# Patient Record
Sex: Female | Born: 1970 | Hispanic: No | Marital: Single | Smoking: Never smoker
Health system: Southern US, Community
[De-identification: ages and names within clinical notes are randomized; demographics above are authoritative.]

## PROBLEM LIST (undated history)

## (undated) DIAGNOSIS — R87629 Unspecified abnormal cytological findings in specimens from vagina: Principal | ICD-10-CM

## (undated) DIAGNOSIS — T7840XA Allergy, unspecified, initial encounter: Secondary | ICD-10-CM

## (undated) DIAGNOSIS — M1990 Unspecified osteoarthritis, unspecified site: Secondary | ICD-10-CM

## (undated) DIAGNOSIS — R87619 Unspecified abnormal cytological findings in specimens from cervix uteri: Secondary | ICD-10-CM

## (undated) DIAGNOSIS — F32A Depression, unspecified: Secondary | ICD-10-CM

## (undated) DIAGNOSIS — IMO0002 Reserved for concepts with insufficient information to code with codable children: Secondary | ICD-10-CM

## (undated) DIAGNOSIS — F419 Anxiety disorder, unspecified: Secondary | ICD-10-CM

## (undated) DIAGNOSIS — O24419 Gestational diabetes mellitus in pregnancy, unspecified control: Secondary | ICD-10-CM

## (undated) DIAGNOSIS — D591 Other autoimmune hemolytic anemias: Secondary | ICD-10-CM

## (undated) DIAGNOSIS — M3500 Sicca syndrome, unspecified: Secondary | ICD-10-CM

## (undated) DIAGNOSIS — M329 Systemic lupus erythematosus, unspecified: Secondary | ICD-10-CM

## (undated) HISTORY — PX: DILATION AND CURETTAGE OF UTERUS: SHX78

## (undated) HISTORY — DX: Reserved for concepts with insufficient information to code with codable children: IMO0002

## (undated) HISTORY — DX: Sicca syndrome, unspecified: M35.00

## (undated) HISTORY — DX: Unspecified abnormal cytological findings in specimens from cervix uteri: R87.619

## (undated) HISTORY — DX: Unspecified abnormal cytological findings in specimens from vagina: R87.629

## (undated) HISTORY — PX: TUBAL LIGATION: SHX77

## (undated) HISTORY — DX: Anxiety disorder, unspecified: F41.9

## (undated) HISTORY — DX: Gestational diabetes mellitus in pregnancy, unspecified control: O24.419

## (undated) HISTORY — DX: Systemic lupus erythematosus, unspecified: M32.9

## (undated) HISTORY — DX: Allergy, unspecified, initial encounter: T78.40XA

## (undated) HISTORY — PX: COLPOSCOPY: SHX161

## (undated) HISTORY — PX: PELVIC LAPAROSCOPY: SHX162

## (undated) HISTORY — DX: Depression, unspecified: F32.A

## (undated) HISTORY — PX: CERVICAL BIOPSY  W/ LOOP ELECTRODE EXCISION: SUR135

## (undated) HISTORY — DX: Other autoimmune hemolytic anemias: D59.1

## (undated) HISTORY — DX: Unspecified osteoarthritis, unspecified site: M19.90

---

## 2006-11-21 HISTORY — PX: LAPAROTOMY: SHX154

## 2006-11-21 HISTORY — PX: TOTAL ABDOMINAL HYSTERECTOMY: SHX209

## 2017-03-07 LAB — BASIC METABOLIC PANEL
BUN: 17 (ref 4–21)
Creatinine: 0.7 (ref 0.5–1.1)
Glucose: 102
Potassium: 3.7 (ref 3.4–5.3)
SODIUM: 140 (ref 137–147)

## 2017-03-07 LAB — CBC AND DIFFERENTIAL
HEMATOCRIT: 29 — AB (ref 36–46)
HEMOGLOBIN: 9.1 — AB (ref 12.0–16.0)
NEUTROS ABS: 1
PLATELETS: 190 (ref 150–399)
WBC: 3

## 2017-03-07 LAB — HEPATIC FUNCTION PANEL
ALK PHOS: 59 (ref 25–125)
ALT: 13 (ref 7–35)
AST: 26 (ref 13–35)
BILIRUBIN, TOTAL: 0.6

## 2017-04-04 LAB — CBC AND DIFFERENTIAL
HCT: 30 — AB (ref 36–46)
Hemoglobin: 9.5 — AB (ref 12.0–16.0)
Neutrophils Absolute: 1
Platelets: 169 (ref 150–399)
WBC: 3.1

## 2017-04-04 LAB — BASIC METABOLIC PANEL
BUN: 19 (ref 4–21)
CREATININE: 0.7 (ref 0.5–1.1)
Glucose: 85
Potassium: 3.6 (ref 3.4–5.3)
SODIUM: 139 (ref 137–147)

## 2017-04-04 LAB — HEPATIC FUNCTION PANEL
ALK PHOS: 55 (ref 25–125)
ALT: 10 (ref 7–35)
AST: 21 (ref 13–35)
Bilirubin, Total: 0.6

## 2017-05-02 LAB — BASIC METABOLIC PANEL
BUN: 18 (ref 4–21)
Creatinine: 0.7 (ref 0.5–1.1)
GLUCOSE: 81
Potassium: 3.5 (ref 3.4–5.3)
Sodium: 139 (ref 137–147)

## 2017-05-02 LAB — CBC AND DIFFERENTIAL
HEMATOCRIT: 29 — AB (ref 36–46)
Hemoglobin: 9.6 — AB (ref 12.0–16.0)
NEUTROS ABS: 1
Platelets: 187 (ref 150–399)
WBC: 3.4

## 2017-05-02 LAB — HEPATIC FUNCTION PANEL
ALT: 12 (ref 7–35)
AST: 18 (ref 13–35)
Alkaline Phosphatase: 59 (ref 25–125)
BILIRUBIN, TOTAL: 0.4

## 2017-05-23 ENCOUNTER — Encounter: Payer: Self-pay | Admitting: Physician Assistant

## 2017-05-23 ENCOUNTER — Ambulatory Visit (INDEPENDENT_AMBULATORY_CARE_PROVIDER_SITE_OTHER): Payer: Medicare Other | Admitting: Physician Assistant

## 2017-05-23 VITALS — BP 140/80 | HR 80 | Temp 98.8°F | Ht 69.5 in | Wt 250.2 lb

## 2017-05-23 DIAGNOSIS — L930 Discoid lupus erythematosus: Secondary | ICD-10-CM | POA: Diagnosis not present

## 2017-05-23 DIAGNOSIS — D591 Other autoimmune hemolytic anemias: Secondary | ICD-10-CM | POA: Diagnosis not present

## 2017-05-23 DIAGNOSIS — M328 Other forms of systemic lupus erythematosus: Secondary | ICD-10-CM | POA: Insufficient documentation

## 2017-05-23 DIAGNOSIS — R87619 Unspecified abnormal cytological findings in specimens from cervix uteri: Secondary | ICD-10-CM | POA: Insufficient documentation

## 2017-05-23 DIAGNOSIS — M3500 Sicca syndrome, unspecified: Secondary | ICD-10-CM | POA: Diagnosis not present

## 2017-05-23 DIAGNOSIS — D5919 Other autoimmune hemolytic anemia: Secondary | ICD-10-CM

## 2017-05-23 HISTORY — DX: Other autoimmune hemolytic anemia: D59.19

## 2017-05-23 HISTORY — DX: Sjogren syndrome, unspecified: M35.00

## 2017-05-23 HISTORY — DX: Unspecified abnormal cytological findings in specimens from cervix uteri: R87.619

## 2017-05-23 NOTE — Progress Notes (Signed)
Kerri Young is a 46 y.o. female here to Establish Care and needs referral to rheumatologist for lupus. Has relocated from [facility].  I acted as a Neurosurgeon for [employer], PA-C Corky Mull, LPN  History of Present Illness:   Chief Complaint  Patient presents with  . Establish Care  . Needs referral for Rheumatology    Lupus   Kerri Young recently moved from [place], [place] and has a history of lupus and hemolytic anemia --> receives bi-weekly IVIG infusions. She does not have any records with her for me to review today except a list of her medications. We have put in record releases for her Ob-Gyn and Rheumatologist.  Acute Concerns: None  Chronic Issues: Lupus -- was diagnosed in 1994; gets infusions every other week; currently managed on Imuran, Belimumab, and Plaquinil; was told that she had kidney issues at first (saw a nephrologist at first diagnosis and was told her kidneys were fine); has significant pain and swelling from arthritis controlled with several pain medications (Methadone, Dilaudid); also has anxiety/depression and mental clarity issues managed with medications (Xanax prn, Celexa, Wellbutrin); skin manifestations include rashes and hives; no current cardiac issues (did see a cardiologist soon after dx and got a clean bill of health and has not returned) or pulmonary issue; no issues with spleen. 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, managed by her rheumatologist for this Atypical cells of cervix -- has had abnormal PAP smears and after hysterectomy was told that she had "unusual cells" in her ovaries and this was monitored by a Gyn-Onc, was never diagnosed with cancer Sjogrens -- has dry eyes, sees ophthalmologist q 6-12 months, is on long-term plaquenil  Health Maintenance: Immunizations -- requesting records Colonoscopy -- has never had a colonoscopy Mammogram -- up to date -- all normal PAP -- Feb 2018 - abnormal Weight -- Weight: 250 lb 4 oz (113.5 kg)    Depression screen [facility] 2/9 05/23/2017  Decreased Interest 0  Down, Depressed, Hopeless 0  PHQ - 2 Score 0    No flowsheet data found.  Other providers/specialists: Rheumatologist -- Dr. Mikey Bussing, [phone] Oncologist/Gynecologist -- Dr. Davy Pique, [phone]     Past Medical History:  Diagnosis Date  . Atypical endocervical cells on Pap smear 05/23/2017  . Gestational diabetes   . Lupus   . 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 Sjogren's syndrome (HCC) 05/23/2017     Social History   Social History  . Marital status: Single    Spouse name: N/A  . Number of children: N/A  . Years of education: N/A   Occupational History  . Not on file.   Social History Main Topics  . Smoking status: Never Smoker  . Smokeless tobacco: Never Used  . Alcohol use Yes     Comment: glass of wine 1-2 x  per month  . Drug use: No  . Sexual activity: No   Other Topics Concern  . Not on file   Social History Narrative   Was living in [place]   Just arrived this week   Two daughters    Past Surgical History:  Procedure Laterality Date  . CESAREAN SECTION  1996, 1999  . LAPAROTOMY  2008   x 2   . TOTAL ABDOMINAL HYSTERECTOMY  2008    Family History  Problem Relation Age of Onset  . Heart disease Mother   . Hypertension Mother   . Heart disease Maternal Grandfather     Allergies  Allergen Reactions  . Penicillins Other (See Comments)    Told not to take due to Lupus  . Sulfa Antibiotics Other (See Comments)    Told not to take due to Lupus  . Tramadol Other (See Comments)    Confusion and disorientation  . Trazodone And Nefazodone Hives     Current Medications:   Current Outpatient Prescriptions:  .  ALPRAZolam (XANAX) 0.5 MG tablet, Take 0.5 mg by mouth as needed for anxiety., Disp: , Rfl:  .  azaTHIOprine (IMURAN) 50 MG tablet, Take 150 mg by mouth daily., Disp: , Rfl:  .  baclofen (LIORESAL) 20 MG tablet, Take 20 mg by mouth as  needed for muscle spasms., Disp: , Rfl:  .  Belimumab (BENLYSTA IV), Inject into the vein every 30 (thirty) days., Disp: , Rfl:  .  buPROPion (WELLBUTRIN XL) 300 MG 24 hr tablet, Take 300 mg by mouth daily., Disp: , Rfl:  .  citalopram (CELEXA) 20 MG tablet, Take 20 mg by mouth daily., Disp: , Rfl:  .  CLORAZEPATE DIPOTASSIUM PO, Take 0.5 mg by mouth daily., Disp: , Rfl:  .  dapsone 100 MG tablet, Take 200 mg by mouth daily., Disp: , Rfl:  .  estradiol (ESTRACE) 1 MG tablet, Take 1 mg by mouth daily., Disp: , Rfl:  .  flurbiprofen (ANSAID) 100 MG tablet, Take 300 mg by mouth daily., Disp: , Rfl:  .  gabapentin (NEURONTIN) 800 MG tablet, Take 600 mg by mouth 3 (three) times daily., Disp: , Rfl:  .  HYDROmorphone (DILAUDID) 4 MG tablet, Take 4 mg by mouth as needed for severe pain., Disp: , Rfl:  .  hydroxychloroquine (PLAQUENIL) 200 MG tablet, Take 400 mg by mouth daily., Disp: , Rfl:  .  methadone (DOLOPHINE) 10 MG tablet, Take 40 mg by mouth daily., Disp: , Rfl:  .  pilocarpine (SALAGEN) 5 MG tablet, Take 5 mg by mouth as needed., Disp: , Rfl:  .  promethazine (PHENERGAN) 25 MG tablet, Take 25 mg by mouth every 6 (six) hours as needed for nausea or vomiting., Disp: , Rfl:    Review of Systems:   Review of Systems  Constitutional: Negative for chills, fever, malaise/fatigue and weight loss.  Eyes: Negative for blurred vision and double vision.  Cardiovascular: Negative for chest pain and palpitations.  Gastrointestinal: Negative for abdominal pain, heartburn, nausea and vomiting.  Neurological: Negative for dizziness and headaches.    Vitals:   Vitals:   05/23/17 1350  BP: 140/80  Pulse: 80  Temp: 98.8 F (37.1 C)  TempSrc: Oral  SpO2: 95%  Weight: 250 lb 4 oz (113.5 kg)  Height: 5' 9.5" (1.765 m)     Body mass index is 36.43 kg/m.  Physical Exam:   Physical Exam  Constitutional: Vital signs are normal. She appears well-developed. She is cooperative.  Non-toxic appearance.  She does not have a sickly appearance. She does not appear ill. No distress.  Cardiovascular: Normal rate, regular rhythm, S1 normal, S2 normal, normal heart sounds and normal pulses.   No LE edema  Pulmonary/Chest: Effort normal and breath sounds normal.  Neurological: She is alert.  Skin: Skin is warm and intact.  Psychiatric:  Pleasant and cheerful  Nursing note and vitals reviewed.   Assessment and Plan:    Inas was seen today for establish care and needs referral for rheumatology.  Diagnoses and all orders for this visit:  Lupus erythematosus, unspecified form, Other autoimmune hemolytic anemias (HCC), Sjogren's syndrome, with  unspecified organ involvement (HCC) She is currently stable on current medication regimen. Patient was unable to find a rheumatologist prior to her move. I have requested that she be seen by rheumatology as soon as possible in order to transition and resume her care. I will be happy to refer her to a hematologist if rheumatology is unable to manage her hemolytic anemia. -     Ambulatory referral to Rheumatology  Atypical endocervical cells on Pap smear Patient was given information for local OB/GYN's in the area. We are requesting records from her office. She is currently stable and asymptomatic.  Marland Kitchen. Reviewed expectations re: course of current medical issues. . Discussed self-management of symptoms. . Outlined signs and symptoms indicating need for more acute intervention. . Patient verbalized understanding and all questions were answered. . See orders for this visit as documented in the electronic medical record. . Patient received an After-Visit Summary.  CMA or LPN served as scribe during this visit. History, Physical, and Plan performed by medical provider. Documentation and orders reviewed and attested to.  Jarold MottoSamantha Jose Alleyne, PA-C

## 2017-05-23 NOTE — Patient Instructions (Signed)
It was great meeting you!  You will be contacted about your rheumatology referral.

## 2017-05-29 ENCOUNTER — Telehealth: Payer: Self-pay | Admitting: Physician Assistant

## 2017-05-29 MED ORDER — ESTRADIOL 1 MG PO TABS: 1.0000 mg | ORAL_TABLET | Freq: Every day | ORAL | 0 refills | Status: DC

## 2017-05-29 NOTE — Telephone Encounter (Signed)
Left message on voicemail Rx sent to pharmacy as requested. 

## 2017-05-29 NOTE — Telephone Encounter (Signed)
**  Remind patient they can make refill requests via MyChart**  Medication refill request (Name & Dosage):  estradiol (ESTRACE) 1 MG tablet [[id number]][[id number]]     Preferred pharmacy (Name & Address):  [facility] [phone][id number] [place], [place] - [address] [phone] (Phone) [phone] (Fax)      Other comments (if applicable):

## 2017-06-12 ENCOUNTER — Encounter: Payer: Self-pay | Admitting: Physician Assistant

## 2017-06-12 LAB — ESTIMATED GFR: EGFR (African American): >60

## 2017-06-12 LAB — C REACTIVE PROTEIN, FLUID
CRP MG/L: 1.3
CRP: 3.1
CRP: 3.1

## 2017-06-13 LAB — CBC AND DIFFERENTIAL
HCT: 34 — AB (ref 36–46)
Hemoglobin: 10.8 — AB (ref 12.0–16.0)
Platelets: 211 (ref 150–399)
WBC: 3.4

## 2017-06-13 LAB — HEPATIC FUNCTION PANEL
ALT: 7 (ref 7–35)
AST: 20 (ref 13–35)

## 2017-06-13 LAB — BASIC METABOLIC PANEL
Creatinine: 0.8 (ref 0.5–1.1)
Glucose: 88

## 2017-06-15 ENCOUNTER — Encounter: Payer: Self-pay | Admitting: Physician Assistant

## 2017-06-15 LAB — PROTEIN, URINE, RANDOM: PROTEIN UR: 17.5

## 2017-06-21 ENCOUNTER — Encounter: Payer: Self-pay | Admitting: Physician Assistant

## 2017-06-21 LAB — SJOGREN'S ANTI-SS-A: Sjogren's Anti-SS-A: 1.4

## 2017-06-27 DIAGNOSIS — M329 Systemic lupus erythematosus, unspecified: Secondary | ICD-10-CM

## 2017-06-27 DIAGNOSIS — D591 Other autoimmune hemolytic anemias: Secondary | ICD-10-CM

## 2017-08-01 LAB — BASIC METABOLIC PANEL
BUN: 23 — AB (ref 4–21)
CREATININE: 0.9 (ref 0.5–1.1)
Glucose: 73
POTASSIUM: 3.7 (ref 3.4–5.3)
SODIUM: 140 (ref 137–147)

## 2017-08-01 LAB — CBC AND DIFFERENTIAL
HCT: 28 — AB (ref 36–46)
HEMOGLOBIN: 9 — AB (ref 12.0–16.0)
Neutrophils Absolute: 1
Platelets: 193 (ref 150–399)
WBC: 2.9

## 2017-08-01 LAB — HEPATIC FUNCTION PANEL
ALK PHOS: 51 (ref 25–125)
ALT: 7 (ref 7–35)
AST: 19 (ref 13–35)
Bilirubin, Total: 0.7

## 2017-08-03 ENCOUNTER — Encounter: Payer: Self-pay | Admitting: Physician Assistant

## 2017-08-03 LAB — ESTIMATED GFR

## 2017-08-23 ENCOUNTER — Other Ambulatory Visit: Payer: Self-pay | Admitting: Physician Assistant

## 2017-08-23 MED ORDER — ESTRADIOL 1 MG PO TABS: 1.0000 mg | ORAL_TABLET | Freq: Every day | ORAL | 0 refills | Status: DC

## 2017-11-17 ENCOUNTER — Other Ambulatory Visit: Payer: Self-pay | Admitting: Physician Assistant

## 2017-11-17 NOTE — Telephone Encounter (Signed)
Pt last seen by Sam on 05/23/17. She does not have f/u appointment.

## 2017-11-18 NOTE — Telephone Encounter (Signed)
Okay refill for 3 months supply. Needs follow up before next refill - inform patient.

## 2017-11-20 ENCOUNTER — Other Ambulatory Visit: Payer: Self-pay | Admitting: Surgical

## 2017-11-20 MED ORDER — ESTRADIOL 1 MG PO TABS: 1.0000 mg | ORAL_TABLET | Freq: Every day | ORAL | 0 refills | Status: DC

## 2017-11-29 ENCOUNTER — Encounter: Payer: Self-pay | Admitting: Physician Assistant

## 2017-11-29 DIAGNOSIS — Z8742 Personal history of other diseases of the female genital tract: Secondary | ICD-10-CM

## 2017-11-29 NOTE — Telephone Encounter (Signed)
Please advise Female GYN for pt.

## 2017-12-01 ENCOUNTER — Telehealth: Payer: Self-pay | Admitting: Obstetrics and Gynecology

## 2017-12-01 NOTE — Telephone Encounter (Signed)
Called and left a message for patient to call back to schedule a new patient doctor referral appointment with our office for an annual well-woman exam with a history of abnormal paps. °

## 2017-12-04 NOTE — Telephone Encounter (Signed)
Called and left a message for patient to call back to schedule a new patient doctor referral appointment with our office for an annual well-woman exam with a history of abnormal paps. °

## 2017-12-05 NOTE — Telephone Encounter (Signed)
Called and left a message for patient to call back to schedule a new patient doctor referral appointment with our office for an annual well-woman exam with a history of abnormal paps.

## 2018-01-23 ENCOUNTER — Encounter: Payer: Self-pay | Admitting: Certified Nurse Midwife

## 2018-01-23 ENCOUNTER — Other Ambulatory Visit: Payer: Self-pay

## 2018-01-23 ENCOUNTER — Ambulatory Visit (INDEPENDENT_AMBULATORY_CARE_PROVIDER_SITE_OTHER): Payer: Medicare Other | Admitting: Certified Nurse Midwife

## 2018-01-23 ENCOUNTER — Other Ambulatory Visit (HOSPITAL_COMMUNITY)
Admission: RE | Admit: 2018-01-23 | Discharge: 2018-01-23 | Disposition: A | Discharge status: Home / Self Care | Payer: Medicare Other | Source: Ambulatory Visit | Attending: Certified Nurse Midwife | Admitting: Certified Nurse Midwife

## 2018-01-23 VITALS — BP 142/84 | HR 72 | Resp 16 | Ht 69.25 in | Wt 205.0 lb

## 2018-01-23 DIAGNOSIS — Z862 Personal history of diseases of the blood and blood-forming organs and certain disorders involving the immune mechanism: Secondary | ICD-10-CM | POA: Diagnosis not present

## 2018-01-23 DIAGNOSIS — Z8739 Personal history of other diseases of the musculoskeletal system and connective tissue: Secondary | ICD-10-CM | POA: Diagnosis not present

## 2018-01-23 DIAGNOSIS — Z124 Encounter for screening for malignant neoplasm of cervix: Secondary | ICD-10-CM

## 2018-01-23 DIAGNOSIS — Z87898 Personal history of other specified conditions: Secondary | ICD-10-CM

## 2018-01-23 DIAGNOSIS — IMO0002 Reserved for concepts with insufficient information to code with codable children: Secondary | ICD-10-CM

## 2018-01-23 DIAGNOSIS — Z01419 Encounter for gynecological examination (general) (routine) without abnormal findings: Secondary | ICD-10-CM

## 2018-01-23 DIAGNOSIS — M3500 Sicca syndrome, unspecified: Secondary | ICD-10-CM

## 2018-01-23 DIAGNOSIS — M329 Systemic lupus erythematosus, unspecified: Secondary | ICD-10-CM

## 2018-01-23 DIAGNOSIS — Z8742 Personal history of other diseases of the female genital tract: Secondary | ICD-10-CM

## 2018-01-23 NOTE — Progress Notes (Signed)
47 y.o. [id number] Single  African American Fe here to establish gyn care and for aex exam. Last aex a year ago. History of abnormal vaginal pap smears with some Gyn Onc follow up and some colposcop[place] in [place].Marland Kitchen. History of TAH for history of ovarian cysts with ovaries removed. Denies history of hypertension, sees Rheumatology for Lupus and hemolytic anemia,Sjogrens management.Kerri Young. Sees Samantha [name] as PCP and managed Estradiol. Denies history of vaginal bleeding or pain. Recent move to the area in past year from [place] due to expenses. Sexual active, no STD concerns or testing needed. No other health issues today  No LMP recorded. Patient has had a hysterectomy.          Sexually active: Yes.    The current method of family planning is status post hysterectomy.    Exercising: Yes.    yoga Smoker:  no  Health Maintenance: Pap:  7053yr ago neg History of abnormal Pap: yes also vaginal paps abnormal  MMG:  2018 neg per patient in [place] Self Breast exams: yes Colonoscopy:  none BMD:   none TDaP:  Has been in last 10 years Shingles: no Pneumonia: no Hep C and HIV: HIV neg Labs:  If needed   reports that  has never smoked. she has never used smokeless tobacco. She reports that she drinks about 1.8 oz of alcohol per week. She reports that she does not use drugs.  Past Medical History:  Diagnosis Date  . Atypical endocervical cells on Pap smear 05/23/2017  . Gestational diabetes   . Lupus   . 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 Sjogren's syndrome (HCC) 05/23/2017    Past Surgical History:  Procedure Laterality Date  . CERVICAL BIOPSY  W/ LOOP ELECTRODE EXCISION    . CESAREAN SECTION  1996, 1999  . COLPOSCOPY    . DILATION AND CURETTAGE OF UTERUS    . LAPAROTOMY  2008   x 2   . PELVIC LAPAROSCOPY    . TOTAL ABDOMINAL HYSTERECTOMY  2008  . TUBAL LIGATION      Current Outpatient Medications  Medication Sig Dispense Refill  . azaTHIOprine (IMURAN) 50 MG  tablet Take 150 mg by mouth daily.    . Belimumab (BENLYSTA IV) Inject into the vein every 30 (thirty) days.    Marland Kitchen. buPROPion (WELLBUTRIN XL) 300 MG 24 hr tablet Take 300 mg by mouth daily.    . citalopram (CELEXA) 10 MG tablet Take 10 mg by mouth daily.    . dapsone 100 MG tablet Take 200 mg by mouth daily.    Marland Kitchen. estradiol (ESTRACE) 1 MG tablet Take 1 tablet (1 mg total) by mouth daily. 90 tablet 0  . flurbiprofen (ANSAID) 100 MG tablet Take 300 mg by mouth daily.    Marland Kitchen. gabapentin (NEURONTIN) 300 MG capsule Take 1,800 mg by mouth 3 (three) times daily.    . hydroxychloroquine (PLAQUENIL) 200 MG tablet Take 400 mg by mouth daily.    . methadone (DOLOPHINE) 10 MG tablet Take 40 mg by mouth daily.     No current facility-administered medications for this visit.     Family History  Problem Relation Age of Onset  . Heart disease Mother   . Hypertension Mother   . Heart disease Maternal Grandfather     ROS:  Pertinent items are noted in HPI.  Otherwise, a comprehensive ROS was negative.  Exam:   BP (!) 142/84   Pulse 72   Resp 16   Ht  5' 9.25" (1.759 m)   Wt 205 lb (93 kg)   BMI 30.06 kg/m  Height: 5' 9.25" (175.9 cm) Ht Readings from Last 3 Encounters:  01/23/18 5' 9.25" (1.759 m)  05/23/17 5' 9.5" (1.765 m)    General appearance: alert, cooperative and appears stated age Head: Normocephalic, without obvious abnormality, atraumatic Neck: no adenopathy, supple, symmetrical, trachea midline and thyroid normal to inspection and palpation and appearance of enlargement, but not palpated Lungs: clear to auscultation bilaterally Breasts: normal appearance, no masses or tenderness, No nipple retraction or dimpling, No nipple discharge or bleeding, No axillary or supraclavicular adenopathy Heart: regular rate and rhythm Abdomen: soft, non-tender; no masses,  no organomegaly Extremities: extremities normal, atraumatic, no cyanosis or edema Skin: Skin color, texture, turgor normal. No rashes  or lesions Lymph nodes: Cervical, supraclavicular, and axillary nodes normal. No abnormal inguinal nodes palpated Neurologic: Grossly normal   Pelvic: External genitalia:  no lesions              Urethra:  normal appearing urethra with no masses, tenderness or lesions              Bartholin's and Skene's: normal                 Vagina: normal appearing vagina with normal color and discharge, no lesions              Cervix: absent              Pap taken: Yes.   Bimanual Exam:  Uterus:  uterus absent              Adnexa: no mass, fullness, tenderness               Rectovaginal: Confirms               Anus:  normal sphincter tone, no lesions  Chaperone present: yes  A:  Well Woman with normal exam  Contraception S/P TAH BSO for ovarian cysts/fibroid and abnormal pap smears with LEEP  History of Abnormal vaginal pap smears with colposcopy, no treatment   Estrace management with PCP  History of Lupus,Hemolytic anemia,Sjogrens with rheumatology management.  P:   Reviewed health and wellness pertinent to exam  Will wait for pap results and request results from previous  Pap smears. Discussed needs routine yearly pap smears due to immune status with Lupus. Patient aware. Questions addressed.  Continue follow up with MD's as indicated.  Pap smear: yes   counseled on breast self exam, mammography screening, STD prevention, HIV risk factors and prevention, adequate intake of calcium and vitamin D, diet and exercise  return annually or prn  An After Visit Summary was printed and given to the patient.

## 2018-01-23 NOTE — Patient Instructions (Signed)

## 2018-01-25 LAB — CYTOLOGY - PAP
Diagnosis: NEGATIVE
HPV: NOT DETECTED

## 2018-02-18 ENCOUNTER — Other Ambulatory Visit: Payer: Self-pay | Admitting: Family Medicine

## 2018-02-19 MED ORDER — ESTRADIOL 1 MG PO TABS: 1.0000 mg | ORAL_TABLET | Freq: Every day | ORAL | 0 refills | Status: DC

## 2018-02-19 NOTE — Telephone Encounter (Signed)
Please advise. [name] patient.

## 2018-04-11 ENCOUNTER — Ambulatory Visit: Payer: Medicare Other | Admitting: Certified Nurse Midwife

## 2018-04-11 ENCOUNTER — Encounter: Payer: Self-pay | Admitting: Certified Nurse Midwife

## 2018-04-11 ENCOUNTER — Telehealth: Payer: Self-pay | Admitting: Certified Nurse Midwife

## 2018-04-11 VITALS — BP 116/62 | HR 70 | Temp 98.0°F | Resp 16 | Ht 69.25 in | Wt 206.0 lb

## 2018-04-11 DIAGNOSIS — N390 Urinary tract infection, site not specified: Secondary | ICD-10-CM

## 2018-04-11 DIAGNOSIS — N898 Other specified noninflammatory disorders of vagina: Secondary | ICD-10-CM

## 2018-04-11 DIAGNOSIS — R319 Hematuria, unspecified: Secondary | ICD-10-CM | POA: Diagnosis not present

## 2018-04-11 LAB — POCT URINALYSIS DIPSTICK
Bilirubin, UA: NEGATIVE
Glucose, UA: NEGATIVE
Ketones, UA: NEGATIVE
NITRITE UA: NEGATIVE
PH UA: 5 (ref 5.0–8.0)
PROTEIN UA: POSITIVE — AB
UROBILINOGEN UA: NEGATIVE U/dL — AB

## 2018-04-11 MED ORDER — CIPROFLOXACIN HCL 500 MG PO TABS: 500.0000 mg | ORAL_TABLET | Freq: Two times a day (BID) | ORAL | 0 refills | Status: DC

## 2018-04-11 NOTE — Progress Notes (Signed)
47 y.o. Single African American female [phone] here with complaint of UTI, with onset of discomfort ? Few weeks ago of "pinching feeling". Patient drinks water all the time,but has noted increase in frequency and urgency. Feeling of incomplete emptying with normal amount, noted also. Patient denies fever, chills, nausea.Has just started some  back pain and noted some blood in urine, so felt she should come in.. No new personal products. Patient feels not related to sexual activity. Partner noted a stronger smell with sexual activity.. Denies any vaginal symptoms that she is aware.of.    Contraception is Hysterectomy.. Patient is consuming adequate water intake. Patient would like vaginal screen to make sure no vaginal infection also. History of UTI's in past, but did feel this was a UTI until last 24 hours due to urine odor. No other health issues today.  Review of Systems  Constitutional: Negative for chills, fever and malaise/fatigue.  Cardiovascular: Negative for chest pain and palpitations.  Gastrointestinal: Negative for abdominal pain, nausea and vomiting.  Genitourinary: Positive for dysuria, frequency, hematuria and urgency.  Musculoskeletal: Positive for back pain.       Slight back pain  Skin: Negative.     O: Healthy female WDWN Affect: Normal, orientation x 3 Skin : warm and dry CVAT: negative bilateral Abdomen: positive for suprapubic tenderness  Pelvic exam: External genital area: normal, no lesions Bladder,Urethra tender, Urethral meatus: tender, red Vagina: normal vaginal discharge, normal appearance, affirm taken  Cervix: absent Uterus:absent Adnexa: no fullness or masses   A: UTI History of UTI with severe occurrence in past Normal pelvic exam poct urine-wbc 2+, rbc 2+, protein 1+ R/O vaginal infection  P: Reviewed findings of UTI and need for treatment. [id number] see order [id number] micro, culture Reviewed warning signs and symptoms of UTI and need to advise if  occurring. Encouraged to limit soda, tea, and coffee and be sure to increase water intake. Lab: Affirm will treat if indicated  RV prn

## 2018-04-11 NOTE — Patient Instructions (Signed)

## 2018-04-11 NOTE — Telephone Encounter (Signed)
Patient sent the following correspondence through MyChart. Routing to triage to assist patient with request.  ----- Message from Mychart, Generic sent at 04/11/2018 10:20 AM EDT -----    Could you call in a prescription for the antibiotic for bacterial vaginosis? There seems to be an odor and since I've seen you nothing else has changed.

## 2018-04-11 NOTE — Telephone Encounter (Signed)
Patient reports blood in urine today when voiding. Has been noticing "bladder pinching" at end of stream for several wks and slight vaginal odor. Denies frequency, urgency, vaginal d/c, fever/chills, pain. Hx of hysterectomy.   Recommended OV for further evaluation. OV scheduled for today at 4pm with Leota Sauers, CNM.  Routing to provider for final review. Patient is agreeable to disposition. Will close encounter.

## 2018-04-11 NOTE — Telephone Encounter (Signed)
Left message to call Milam Allbaugh at [phone].  

## 2018-04-12 ENCOUNTER — Telehealth: Payer: Self-pay

## 2018-04-12 LAB — URINALYSIS, MICROSCOPIC ONLY
Casts: NONE SEEN /lpf
EPITHELIAL CELLS (NON RENAL): NONE SEEN /HPF (ref 0–10)
WBC, UA: >30 /hpf — AB (ref 0–5)

## 2018-04-12 LAB — VAGINITIS/VAGINOSIS, DNA PROBE
Candida Species: NEGATIVE
Gardnerella vaginalis: NEGATIVE
Trichomonas vaginosis: NEGATIVE

## 2018-04-12 NOTE — Telephone Encounter (Signed)
lmtcb

## 2018-04-12 NOTE — Telephone Encounter (Signed)
-----   Message from Verner Chol, CNM sent at 04/12/2018 11:13 AM EDT ----- Notify patient that her vaginal screen was negative for yeast, BV and trichomonas Urine micro positive for WBC and RBC to indicate infection, bacteria few Urine culture pending Patient status?

## 2018-04-13 LAB — URINE CULTURE

## 2018-04-13 NOTE — Telephone Encounter (Signed)
Patient notified of results. See lab 

## 2018-04-23 ENCOUNTER — Encounter: Payer: Self-pay | Admitting: Physician Assistant

## 2018-04-27 ENCOUNTER — Encounter: Payer: Self-pay | Admitting: Physician Assistant

## 2018-04-27 ENCOUNTER — Ambulatory Visit (INDEPENDENT_AMBULATORY_CARE_PROVIDER_SITE_OTHER): Payer: Medicare Other | Admitting: Physician Assistant

## 2018-04-27 VITALS — BP 150/90 | HR 89 | Temp 98.5°F | Ht 69.25 in | Wt 204.2 lb

## 2018-04-27 DIAGNOSIS — F320 Major depressive disorder, single episode, mild: Secondary | ICD-10-CM | POA: Diagnosis not present

## 2018-04-27 DIAGNOSIS — F419 Anxiety disorder, unspecified: Secondary | ICD-10-CM | POA: Diagnosis not present

## 2018-04-27 MED ORDER — BUPROPION HCL ER (XL) 300 MG PO TB24: 300.0000 mg | ORAL_TABLET | Freq: Every day | ORAL | 1 refills | Status: DC

## 2018-04-27 MED ORDER — CITALOPRAM HYDROBROMIDE 20 MG PO TABS: 20.0000 mg | ORAL_TABLET | Freq: Every day | ORAL | 1 refills | Status: DC

## 2018-04-27 NOTE — Progress Notes (Signed)
Kerri Young is a 47 y.o. female is here to discuss: Anxiety and Depression and needs refills on medications.  I acted as a Neurosurgeon for [employer], PA-C Corky Mull, LPN  History of Present Illness:   Chief Complaint  Patient presents with  . Anxiety  . Depression    HPI   She is overall doing well with Wellbubtrin 300 mg daily and Celexa 10 mg daily. She has been on this regimen for at least a few years. She states that she was on other medications before but that this regimen overall works well for her. She continues to see specialists for her lupus and anemia. She does endorse occasional worsening of her anxiety related to her health. She denies any current SI/HI. Denies chest pain, unusual fatigue or SOB.  GAD 7 : Generalized Anxiety Score 04/27/2018  Nervous, Anxious, on Edge 1  Control/stop worrying 1  Worry too much - different things 1  Trouble relaxing 1  Restless 1  Easily annoyed or irritable 1  Afraid - awful might happen 1  Total GAD 7 Score 7  Anxiety Difficulty Somewhat difficult    Depression screen [facility] 2/9 04/27/2018 05/23/2017  Decreased Interest 1 0  Down, Depressed, Hopeless 0 0  PHQ - 2 Score 1 0  Altered sleeping 1 -  Tired, decreased energy 2 -  Change in appetite 1 -  Feeling bad or failure about yourself  0 -  Trouble concentrating 1 -  Moving slowly or fidgety/restless 0 -  Suicidal thoughts 0 -  PHQ-9 Score 6 -  Difficult doing work/chores Somewhat difficult -     Health Maintenance Due  Topic Date Due  . HIV Screening  02/24/1988    Past Medical History:  Diagnosis Date  . Atypical endocervical cells on Pap smear 05/23/2017  . Gestational diabetes   . Lupus (HCC)   . 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 Sjogren's syndrome (HCC) 05/23/2017     Social History   Socioeconomic History  . Marital status: Single    Spouse name: Not on file  . Number of children: Not on file  . Years of education: Not on file  .  Highest education level: Not on file  Occupational History  . Not on file  Social Needs  . Financial resource strain: Not on file  . Food insecurity:    Worry: Not on file    Inability: Not on file  . Transportation needs:    Medical: Not on file    Non-medical: Not on file  Tobacco Use  . Smoking status: Never Smoker  . Smokeless tobacco: Never Used  Substance and Sexual Activity  . Alcohol use: Yes    Alcohol/week: 1.8 oz    Types: 3 Glasses of wine per week  . Drug use: No  . Sexual activity: Yes    Partners: Male    Birth control/protection: Surgical    Comment: hysterectomy  Lifestyle  . Physical activity:    Days per week: Not on file    Minutes per session: Not on file  . Stress: Not on file  Relationships  . Social connections:    Talks on phone: Not on file    Gets together: Not on file    Attends religious service: Not on file    Active member of club or organization: Not on file    Attends meetings of clubs or organizations: Not on file    Relationship status: Not on file  .  Intimate partner violence:    Fear of current or ex partner: Not on file    Emotionally abused: Not on file    Physically abused: Not on file    Forced sexual activity: Not on file  Other Topics Concern  . Not on file  Social History Narrative  . Not on file    Past Surgical History:  Procedure Laterality Date  . CERVICAL BIOPSY  W/ LOOP ELECTRODE EXCISION    . CESAREAN SECTION  1996, 1999  . COLPOSCOPY    . DILATION AND CURETTAGE OF UTERUS    . LAPAROTOMY  2008   x 2   . PELVIC LAPAROSCOPY    . TOTAL ABDOMINAL HYSTERECTOMY  2008  . TUBAL LIGATION      Family History  Problem Relation Age of Onset  . Heart disease Mother   . Hypertension Mother   . Heart disease Maternal Grandfather   . Heart attack Maternal Grandfather   . Diabetes Brother     PMHx, SurgHx, SocialHx, FamHx, Medications, and Allergies were reviewed in the Visit Navigator and updated as appropriate.     Patient Active Problem List   Diagnosis Date Noted  . 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 Other forms of systemic lupus erythematosus (HCC) 05/23/2017  . 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 Sjogren's syndrome (HCC) 05/23/2017  . Atypical endocervical cells on Pap smear 05/23/2017    Social History   Tobacco Use  . Smoking status: Never Smoker  . Smokeless tobacco: Never Used  Substance Use Topics  . Alcohol use: Yes    Alcohol/week: 1.8 oz    Types: 3 Glasses of wine per week  . Drug use: No    Current Medications and Allergies:    Current Outpatient Medications:  .  azaTHIOprine (IMURAN) 50 MG tablet, Take 150 mg by mouth daily., Disp: , Rfl:  .  Belimumab (BENLYSTA IV), Inject into the vein every 30 (thirty) days., Disp: , Rfl:  .  BENLYSTA 200 MG/ML SOAJ, INJECT THE CONTENTS OF 1 PEN INTO THE SKIN EVERY 7 DAYS, Disp: , Rfl: 5 .  buPROPion (WELLBUTRIN XL) 300 MG 24 hr tablet, Take 1 tablet (300 mg total) by mouth daily., Disp: 90 tablet, Rfl: 1 .  dapsone 100 MG tablet, Take 200 mg by mouth daily., Disp: , Rfl:  .  estradiol (ESTRACE) 1 MG tablet, Take 1 tablet (1 mg total) by mouth daily., Disp: 90 tablet, Rfl: 0 .  flurbiprofen (ANSAID) 100 MG tablet, Take 300 mg by mouth daily., Disp: , Rfl:  .  gabapentin (NEURONTIN) 300 MG capsule, Take 1,800 mg by mouth 3 (three) times daily., Disp: , Rfl:  .  hydroxychloroquine (PLAQUENIL) 200 MG tablet, Take 400 mg by mouth daily., Disp: , Rfl:  .  methadone (DOLOPHINE) 10 MG tablet, Take 40 mg by mouth daily. Prescribed by [facility], Disp: , Rfl:  .  citalopram (CELEXA) 20 MG tablet, Take 1 tablet (20 mg total) by mouth daily., Disp: 90 tablet, Rfl: 1   Allergies  Allergen Reactions  . Penicillins Other (See Comments)    Told not to take due to Lupus  . Sulfa Antibiotics Other (See Comments)    Told not to take due to Lupus  . Tramadol Other (See  Comments)    Confusion and disorientation  . Trazodone And Nefazodone Hives    Review of Systems   ROS  Negative unless otherwise specified per HPI.  Vitals:   Vitals:   04/27/18 1002  BP: (!) 150/90  Pulse: 89  Temp: 98.5 F (36.9 C)  TempSrc: Oral  SpO2: (!) 87%  Weight: 204 lb 4 oz (92.6 kg)  Height: 5' 9.25" (1.759 m)     Body mass index is 29.95 kg/m.   Physical Exam:    Physical Exam  Constitutional: She appears well-developed. She is cooperative.  Non-toxic appearance. She does not have a sickly appearance. She does not appear ill. No distress.  Cardiovascular: Normal rate, regular rhythm, S1 normal, S2 normal, normal heart sounds and normal pulses.  No LE edema  Pulmonary/Chest: Effort normal and breath sounds normal.  Neurological: She is alert. GCS eye subscore is 4. GCS verbal subscore is 5. GCS motor subscore is 6.  Skin: Skin is warm, dry and intact.  Psychiatric: She has a normal mood and affect. Her speech is normal and behavior is normal.  Nursing note and vitals reviewed.    Assessment and Plan:    Kerri Young was seen today for anxiety and depression.  Diagnoses and all orders for this visit:  Anxiety and Depression, major, single episode, mild (HCC) Overall well controlled, but she would like to see if she can get a little bit more improvement of anxiety with increasing Celexa to 20 mg daily. I have sent this in for her, as well as refilled Wellbutrin. I recommended decreasing back to 10 mg if this doesn't work well for her. Follow-up in 6 months, sooner if needed. Patient verbalized understanding and is agreeable to plan. No red flags on exam, denies SI/HI.  Other orders -     buPROPion (WELLBUTRIN XL) 300 MG 24 hr tablet; Take 1 tablet (300 mg total) by mouth daily. -     citalopram (CELEXA) 20 MG tablet; Take 1 tablet (20 mg total) by mouth daily.    . Reviewed expectations re: course of current medical issues. . Discussed self-management of  symptoms. . Outlined signs and symptoms indicating need for more acute intervention. . Patient verbalized understanding and all questions were answered. . See orders for this visit as documented in the electronic medical record. . Patient received an After Visit Summary.  CMA or LPN served as scribe during this visit. History, Physical, and Plan performed by medical provider. Documentation and orders reviewed and attested to.   Jarold Motto, PA-C [place] [place] 04/27/2018  Follow-up: No follow-ups on file.

## 2018-04-28 ENCOUNTER — Encounter: Payer: Self-pay | Admitting: Certified Nurse Midwife

## 2018-04-29 ENCOUNTER — Encounter: Payer: Self-pay | Admitting: Certified Nurse Midwife

## 2018-04-30 ENCOUNTER — Other Ambulatory Visit: Payer: Self-pay | Admitting: Certified Nurse Midwife

## 2018-04-30 DIAGNOSIS — Z87440 Personal history of urinary (tract) infections: Secondary | ICD-10-CM

## 2018-04-30 MED ORDER — CIPROFLOXACIN HCL 500 MG PO TABS: 500.0000 mg | ORAL_TABLET | Freq: Two times a day (BID) | ORAL | 0 refills | Status: DC

## 2018-05-18 ENCOUNTER — Other Ambulatory Visit: Payer: Self-pay | Admitting: Physician Assistant

## 2018-05-22 ENCOUNTER — Encounter: Payer: Self-pay | Admitting: Certified Nurse Midwife

## 2018-05-23 ENCOUNTER — Other Ambulatory Visit: Payer: Self-pay | Admitting: Certified Nurse Midwife

## 2018-05-23 DIAGNOSIS — Z79890 Hormone replacement therapy (postmenopausal): Secondary | ICD-10-CM

## 2018-05-23 MED ORDER — ESTRADIOL 1 MG PO TABS: 1.0000 mg | ORAL_TABLET | Freq: Every day | ORAL | 0 refills | Status: DC

## 2018-05-23 NOTE — Progress Notes (Signed)
See My chart message regarding refill

## 2018-06-16 ENCOUNTER — Other Ambulatory Visit: Payer: Self-pay | Admitting: Certified Nurse Midwife

## 2018-06-16 DIAGNOSIS — Z79890 Hormone replacement therapy (postmenopausal): Secondary | ICD-10-CM

## 2018-06-17 NOTE — Telephone Encounter (Signed)
See my chart note. She needs mammogram before next 90 day Rx can go in

## 2018-06-19 ENCOUNTER — Other Ambulatory Visit: Payer: Self-pay | Admitting: Certified Nurse Midwife

## 2018-06-19 ENCOUNTER — Other Ambulatory Visit: Payer: Self-pay | Admitting: Physician Assistant

## 2018-06-19 DIAGNOSIS — Z1231 Encounter for screening mammogram for malignant neoplasm of breast: Secondary | ICD-10-CM

## 2018-06-19 DIAGNOSIS — Z79890 Hormone replacement therapy (postmenopausal): Secondary | ICD-10-CM

## 2018-06-19 NOTE — Telephone Encounter (Signed)
Spoke with patient, advised as seen below per Leota Sauerseborah Leonard, CNM. Patient angry and expressed frustration with refill process for estrogen. Patient adamant estrogen be refilled, states "medication is being held hostage". Patient requesting to speak with supervisor. Patient placed on brief hold, call transferred to S. Raynelle JanYeakley, RN.

## 2018-06-19 NOTE — Telephone Encounter (Signed)
Medication filled by a different provider on 06/19/18

## 2018-06-19 NOTE — Telephone Encounter (Signed)
Request reviewed with Dr Hyacinth MeekerMiller.  Call to patient.Left message to call back.

## 2018-06-19 NOTE — Telephone Encounter (Signed)
Spoke with patient. Patient requesting refill of estradiol. Patient is scheduled for MMG on 07/12/18 at [facility]. Patient states she was not previously advised that MMG would be required for refills of estradiol, the MMG just needed to be scheduled. Reviewed MyChart messages dated 05/22/18 with patient advising on future refills and MMG recommendations.  Patient states she has now scheduled her MMG. Declines assistance with scheduling earlier appt due to other medical appts. Advised patient once MMG is completed, provider will review for further refills. Patient states she has Lupus and "has to have estrogen". Patient request this be reviewed with provider and call returned.   Leota Sauerseborah Leonard, CNM -please review.

## 2018-06-19 NOTE — Telephone Encounter (Signed)
[place] 10[place]17 was negative. Estrogen can increase risk with breast changes that is why mammogram is so important . Please encourage her to let you move up her screen date so Rx can be filled.

## 2018-06-19 NOTE — Telephone Encounter (Signed)
Patient called back and she did indeed schedule her MMG [facility]/12/18 [facility]. Patient said no need to return her call, just needs a refill.

## 2018-06-19 NOTE — Telephone Encounter (Signed)
Medication refill request: estradiol 1mg  Last AEX:  01/23/18 Next AEX: 01/29/19 Last MMG (if hormonal medication request): 08/30/16 Bi-rads category 1 neg  Refill authorized: Please refill if appropriate.

## 2018-06-19 NOTE — Telephone Encounter (Signed)
Call to patient. Per ROI, can leave message on voice mail. Number confirmed on call log. Left message that Dr Hyacinth MeekerMiller has made exception and [facility].  Can call me back if additional questions.  Encounter closed.   CC: Lovett Soxebbi Leonard, CNM.

## 2018-06-19 NOTE — Telephone Encounter (Signed)
Copied from CRM [id number]. Topic: Quick Communication - Rx Refill/Question >> Jun 19, 2018 12:01 PM Oneal GroutSebastian, Jennifer S wrote: Medication: estradiol (ESTRACE) 1 MG tablet   Has the patient contacted their pharmacy? Yes.   (Agent: If no, request that the patient contact the pharmacy for the refill.) (Agent: If yes, when and what did the pharmacy advise?)  Preferred Pharm[facility] (with phone number [facility] [address]  Agent: Please be advised that RX refills may take up to 3 business days. We ask that you follow-up with your pharmacy.

## 2018-06-19 NOTE — Telephone Encounter (Addendum)
Patient is asking to talk with a nurse about her prescription being denied. Patient said she is without her prescription and does not understand why she is being denied.

## 2018-06-19 NOTE — Telephone Encounter (Signed)
Call received from Carmelina DaneJill Hamm, RN. Patient reports she was not adequately advised that mammogram was required to to receive refills of estradiol.  She reports she has been on this medication for 10 years and if she does not receive refills it will cause her to have "Lupus flare."  Reviewed policy as directed by Lovett Soxebbi Leonard. Advised this is our policy across the office.  First available mammogram appointment that meets patients schedule requirements is 07-12-18. Noreene Larsson(Jill offered to call to move it up but patient states she cant go at 7am or during her infusion days.)  Patient continues to express frustration regarding refill process.   Advised will review request with supervising physician for exception to get to appointment on 07-12-18.

## 2018-06-26 ENCOUNTER — Ambulatory Visit
Admission: RE | Admit: 2018-06-26 | Discharge: 2018-06-26 | Disposition: A | Discharge status: Home / Self Care | Payer: Medicare Other | Source: Ambulatory Visit | Attending: Certified Nurse Midwife | Admitting: Certified Nurse Midwife

## 2018-06-26 DIAGNOSIS — Z1231 Encounter for screening mammogram for malignant neoplasm of breast: Secondary | ICD-10-CM

## 2018-07-12 ENCOUNTER — Ambulatory Visit: Payer: Medicare Other

## 2018-07-17 ENCOUNTER — Other Ambulatory Visit: Payer: Self-pay | Admitting: Obstetrics & Gynecology

## 2018-07-17 DIAGNOSIS — Z79890 Hormone replacement therapy (postmenopausal): Secondary | ICD-10-CM

## 2018-07-17 NOTE — Telephone Encounter (Signed)
Medication refill request: Estrace 1mg   Last AEX:  01/23/18 Next AEX: 01/29/19 Last MMG (if hormonal medication request): 06/26/18 Bi-rads category 1 neg  Refill authorized: Please refill if appropriate.

## 2018-07-18 ENCOUNTER — Other Ambulatory Visit: Payer: Self-pay | Admitting: Obstetrics & Gynecology

## 2018-07-18 DIAGNOSIS — Z79890 Hormone replacement therapy (postmenopausal): Secondary | ICD-10-CM

## 2018-07-18 MED ORDER — ESTRADIOL 1 MG PO TABS: 1.0000 mg | ORAL_TABLET | Freq: Every day | ORAL | 6 refills | Status: DC

## 2018-08-03 ENCOUNTER — Telehealth: Payer: Self-pay | Admitting: Certified Nurse Midwife

## 2018-08-03 MED ORDER — ESTRADIOL 1 MG PO TABS: 1.0000 mg | ORAL_TABLET | Freq: Every day | ORAL | 1 refills | Status: DC

## 2018-08-03 NOTE — Telephone Encounter (Signed)
Mychart response to patient    RE: Non-Urgent Medical Question 08/03/2018 11:44 AM    To: [name]    From: Joeseph AmorFast, Miguelina Fore L, RN    Created: 08/03/2018 11:44 AM     Ms. Kerri Young,  Thank you for letting us know that you need a 90 day prescription. Leota Sauerseborah Leonard CNM has [facility] with one refill to allow enough refills to last you until your next annual exam. You may need to call [facility] to let them know when you need it if you just picked up a 30 day prescription. Please let us know if you need anything further.   Sincerely,  Almedia Ballsracy Garret Teale, RN

## 2018-08-03 NOTE — Telephone Encounter (Signed)
Patient sent the following message through MyChart. Routing to triage to assist patient with request.   I was previously told the reason I couldnt previously get a 90 day prescription of estradiol was because I had not had a mammogram. I have had a mammogram but the prescription I received last week was still only a 30 day prescription with one refill. What needs to happen so that I receive a 90 day prescription? It is unreasonable that I have to go every few weeks for an estrogen prescription. Please advise.

## 2018-08-03 NOTE — Telephone Encounter (Signed)
Routing to provider to review. Patient has annual exam scheduled for 01/29/2019.   Okay to have Estrace 1 mg po daily #90 with one refill to last until next annual exam?

## 2018-08-03 NOTE — Telephone Encounter (Signed)
Yes, her mammogram is in and reviewed

## 2018-09-28 ENCOUNTER — Other Ambulatory Visit: Payer: Self-pay | Admitting: Physician Assistant

## 2018-11-09 ENCOUNTER — Other Ambulatory Visit: Payer: Self-pay | Admitting: Physician Assistant

## 2018-11-15 ENCOUNTER — Encounter: Payer: Self-pay | Admitting: Physician Assistant

## 2018-11-16 MED ORDER — CITALOPRAM HYDROBROMIDE 20 MG PO TABS: 20.0000 mg | ORAL_TABLET | Freq: Every day | ORAL | 0 refills | Status: DC

## 2018-11-16 NOTE — Telephone Encounter (Signed)
See note

## 2018-11-16 NOTE — Telephone Encounter (Signed)
Patient called to inquire why medication request was denied, informed patient that she would need a follow up appointment. Scheduled patient for appointment on 01/02. Patient states she will be completely out of this medication on 12/28. Patient would like to know if it is possible to have medication sent to pharmacy until her scheduled appointment. Patient is very anxious about running out of this medication. Please advise.   [facility] [id number] [facility] [place] [place] - [address]  [address] [place] [id number]  Phone: [phone] Fax: [phone]  Not a 24 hour pharmacy; exact hours not known.

## 2018-11-16 NOTE — Telephone Encounter (Signed)
Spoke to pt told her will send in 30 day supply only of Celexa must keep appt on 1/2 with Samantha. Pt verbalized understanding. Rx sent to pharmacy.

## 2018-11-16 NOTE — Addendum Note (Signed)
Addended by: Jimmye NormanPHANOS, Donnis Phaneuf J on: 11/16/2018 01:06 PM   Modules accepted: Orders

## 2018-11-22 ENCOUNTER — Ambulatory Visit: Payer: Medicare Other | Admitting: Physician Assistant

## 2018-11-27 ENCOUNTER — Ambulatory Visit (INDEPENDENT_AMBULATORY_CARE_PROVIDER_SITE_OTHER): Payer: Medicare Other | Admitting: Physician Assistant

## 2018-11-27 ENCOUNTER — Encounter: Payer: Self-pay | Admitting: Physician Assistant

## 2018-11-27 VITALS — BP 108/70 | HR 79 | Temp 98.4°F | Ht 69.25 in | Wt 218.5 lb

## 2018-11-27 DIAGNOSIS — F320 Major depressive disorder, single episode, mild: Secondary | ICD-10-CM

## 2018-11-27 DIAGNOSIS — F419 Anxiety disorder, unspecified: Secondary | ICD-10-CM | POA: Diagnosis not present

## 2018-11-27 MED ORDER — CITALOPRAM HYDROBROMIDE 20 MG PO TABS: 20.0000 mg | ORAL_TABLET | Freq: Every day | ORAL | 1 refills | Status: DC

## 2018-11-27 MED ORDER — BUPROPION HCL ER (XL) 300 MG PO TB24: 300.0000 mg | ORAL_TABLET | Freq: Every day | ORAL | 1 refills | Status: DC

## 2018-11-27 NOTE — Progress Notes (Signed)
Kerri Young is a 48 y.o. female is here to discuss: Anxiety and Depression  I acted as a Neurosurgeon for [employer], PA-C Corky Mull, LPN  History of Present Illness:   Chief Complaint  Patient presents with  . Anxiety  . Depression    Anxiety  Presents for follow-up visit. Symptoms include decreased concentration and malaise. Patient reports no chest pain, compulsions, confusion, depressed mood, dizziness, excessive worry, insomnia, irritability, muscle tension, nausea, nervous/anxious behavior, palpitations, panic, restlessness, shortness of breath or suicidal ideas. Symptoms occur rarely. The most recent episode lasted 30 minutes. The severity of symptoms is mild. The quality of sleep is good. Nighttime awakenings: one to two.   Compliance with medications is 76-100%. Treatment side effects: Pt tolerating medication well.  Depression         This is a chronic problem.  Episode onset: Pt has been doing well, has not had any episodes of depression in the past 3 months that she can remember.   The onset quality is sudden.   The problem occurs intermittently.  Associated symptoms include decreased concentration, fatigue and headaches.  Associated symptoms include no helplessness, no hopelessness, does not have insomnia, no restlessness, no decreased interest, no appetite change, not sad and no suicidal ideas.  Past medical history includes anxiety.    GAD 7 : Generalized Anxiety Score 11/27/2018 04/27/2018  Nervous, Anxious, on Edge 1 1  Control/stop worrying 0 1  Worry too much - different things 0 1  Trouble relaxing 1 1  Restless 0 1  Easily annoyed or irritable 0 1  Afraid - awful might happen 0 1  Total GAD 7 Score 2 7  Anxiety Difficulty Not difficult at all Somewhat difficult    Depression screen [facility] 2/9 11/27/2018 04/27/2018 05/23/2017  Decreased Interest 0 1 0  Down, Depressed, Hopeless 0 0 0  PHQ - 2 Score 0 1 0  Altered sleeping 0 1 -  Tired, decreased energy 0 2 -    Change in appetite 0 1 -  Feeling bad or failure about yourself  0 0 -  Trouble concentrating 0 1 -  Moving slowly or fidgety/restless 0 0 -  Suicidal thoughts 0 0 -  PHQ-9 Score 0 6 -  Difficult doing work/chores Not difficult at all Somewhat difficult -    There are no preventive care reminders to display for this patient.  Past Medical History:  Diagnosis Date  . Atypical endocervical cells on Pap smear 05/23/2017  . Gestational diabetes   . Lupus (HCC)   . 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 Sjogren's syndrome (HCC) 05/23/2017     Social History   Socioeconomic History  . Marital status: Single    Spouse name: Not on file  . Number of children: Not on file  . Years of education: Not on file  . Highest education level: Not on file  Occupational History  . Not on file  Social Needs  . Financial resource strain: Not on file  . Food insecurity:    Worry: Not on file    Inability: Not on file  . Transportation needs:    Medical: Not on file    Non-medical: Not on file  Tobacco Use  . Smoking status: Never Smoker  . Smokeless tobacco: Never Used  Substance and Sexual Activity  . Alcohol use: Yes    Alcohol/week: 3.0 standard drinks    Types: 3 Glasses of wine per week  . Drug use: No  .  Sexual activity: Yes    Partners: Male    Birth control/protection: Surgical    Comment: hysterectomy  Lifestyle  . Physical activity:    Days per week: Not on file    Minutes per session: Not on file  . Stress: Not on file  Relationships  . Social connections:    Talks on phone: Not on file    Gets together: Not on file    Attends religious service: Not on file    Active member of club or organization: Not on file    Attends meetings of clubs or organizations: Not on file    Relationship status: Not on file  . Intimate partner violence:    Fear of current or ex partner: Not on file    Emotionally abused: Not on file    Physically abused: Not on file     Forced sexual activity: Not on file  Other Topics Concern  . Not on file  Social History Narrative  . Not on file    Past Surgical History:  Procedure Laterality Date  . CERVICAL BIOPSY  W/ LOOP ELECTRODE EXCISION    . CESAREAN SECTION  1996, 1999  . COLPOSCOPY    . DILATION AND CURETTAGE OF UTERUS    . LAPAROTOMY  2008   x 2   . PELVIC LAPAROSCOPY    . TOTAL ABDOMINAL HYSTERECTOMY  2008  . TUBAL LIGATION      Family History  Problem Relation Age of Onset  . Heart disease Mother   . Hypertension Mother   . Heart disease Maternal Grandfather   . Heart attack Maternal Grandfather   . Diabetes Brother     PMHx, SurgHx, SocialHx, FamHx, Medications, and Allergies were reviewed in the Visit Navigator and updated as appropriate.   Patient Active Problem List   Diagnosis Date Noted  . 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 Other forms of systemic lupus erythematosus (HCC) 05/23/2017  . 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 Sjogren's syndrome (HCC) 05/23/2017  . Atypical endocervical cells on Pap smear 05/23/2017    Social History   Tobacco Use  . Smoking status: Never Smoker  . Smokeless tobacco: Never Used  Substance Use Topics  . Alcohol use: Yes    Alcohol/week: 3.0 standard drinks    Types: 3 Glasses of wine per week  . Drug use: No    Current Medications and Allergies:    Current Outpatient Medications:  .  azaTHIOprine (IMURAN) 50 MG tablet, Take 150 mg by mouth daily., Disp: , Rfl:  .  Belimumab (BENLYSTA IV), Inject into the vein every 30 (thirty) days., Disp: , Rfl:  .  BENLYSTA 200 MG/ML SOAJ, INJECT THE CONTENTS OF 1 PEN INTO THE SKIN EVERY 7 DAYS, Disp: , Rfl: 5 .  buPROPion (WELLBUTRIN XL) 300 MG 24 hr tablet, Take 1 tablet (300 mg total) by mouth daily., Disp: 90 tablet, Rfl: 1 .  Cholecalciferol (VITAMIN D-1000 MAX ST) 25 MCG (1000 UT) tablet, Take by mouth., Disp: , Rfl:  .  citalopram  (CELEXA) 20 MG tablet, Take 1 tablet (20 mg total) by mouth daily., Disp: 90 tablet, Rfl: 1 .  dapsone 100 MG tablet, Take 200 mg by mouth daily., Disp: , Rfl:  .  estradiol (ESTRACE) 1 MG tablet, Take 1 tablet (1 mg total) by mouth daily., Disp: 90 tablet, Rfl: 1 .  flurbiprofen (ANSAID) 100 MG tablet, Take 300 mg by mouth daily., Disp: ,  Rfl:  .  gabapentin (NEURONTIN) 300 MG capsule, Take 1,800 mg by mouth 3 (three) times daily., Disp: , Rfl:  .  hydrocortisone 1 % ointment, Apply to lesion up to 2x/day, Disp: , Rfl:  .  HYDROmorphone (DILAUDID) 2 MG tablet, Take by mouth., Disp: , Rfl:  .  hydroxychloroquine (PLAQUENIL) 200 MG tablet, Take 400 mg by mouth daily., Disp: , Rfl:  .  Immune Globulin, Human, (PRIVIGEN) 40 GM/400ML SOLN, Inject into the vein., Disp: , Rfl:  .  methadone (DOLOPHINE) 10 MG tablet, Take 40 mg by mouth daily. Prescribed by [facility], Disp: , Rfl:  .  nabumetone (RELAFEN) 500 MG tablet, Take by mouth., Disp: , Rfl:  .  vitamin B-12 (CYANOCOBALAMIN) 1000 MCG tablet, Take by mouth., Disp: , Rfl:  .  ALPRAZolam (XANAX) 0.5 MG tablet, Take by mouth., Disp: , Rfl:  .  baclofen (LIORESAL) 20 MG tablet, Take by mouth., Disp: , Rfl:  .  methadone (DOLOPHINE) 10 MG tablet, Take by mouth., Disp: , Rfl:  .  pilocarpine (SALAGEN) 5 MG tablet, Take by mouth., Disp: , Rfl:  .  promethazine (PHENERGAN) 25 MG tablet, Take by mouth., Disp: , Rfl:    Allergies  Allergen Reactions  . Penicillins Other (See Comments)    Told not to take due to Lupus  . Sulfa Antibiotics Other (See Comments)    Told not to take due to Lupus  . Tramadol Other (See Comments)    Confusion and disorientation  . Trazodone And Nefazodone Hives    Review of Systems   Review of Systems  Constitutional: Positive for fatigue. Negative for appetite change and irritability.  Respiratory: Negative for shortness of breath.   Cardiovascular: Negative for chest pain and palpitations.    Gastrointestinal: Negative for nausea.  Neurological: Positive for headaches. Negative for dizziness.  Psychiatric/Behavioral: Positive for decreased concentration and depression. Negative for confusion and suicidal ideas. The patient is not nervous/anxious and does not have insomnia.     Vitals:   Vitals:   11/27/18 1021  BP: 108/70  Pulse: 79  Temp: 98.4 F (36.9 C)  TempSrc: Oral  SpO2: 91%  Weight: 218 lb 8 oz (99.1 kg)  Height: 5' 9.25" (1.759 m)     Body mass index is 32.03 kg/m.   Physical Exam:   Physical Exam Vitals signs and nursing note reviewed.  Constitutional:      General: She is not in acute distress.    Appearance: She is well-developed. She is not ill-appearing or toxic-appearing.  Cardiovascular:     Rate and Rhythm: Normal rate and regular rhythm.     Pulses: Normal pulses.     Heart sounds: Normal heart sounds, S1 normal and S2 normal.     Comments: No LE edema Pulmonary:     Effort: Pulmonary effort is normal.     Breath sounds: Normal breath sounds.  Skin:    General: Skin is warm and dry.  Neurological:     Mental Status: She is alert.     GCS: GCS eye subscore is 4. GCS verbal subscore is 5. GCS motor subscore is 6.  Psychiatric:        Speech: Speech normal.        Behavior: Behavior normal. Behavior is cooperative.      Assessment and Plan:    Sherene SiresKarita was seen today for anxiety and depression.  Diagnoses and all orders for this visit:  Anxiety and Depression, major, single episode, mild (  HCC) Well-controlled presently. Will continue current regimen of celexa 20 mg and wellbutrin 300 mg daily. Denies SI/HI. Follow-up in 6 months.  Other orders -     citalopram (CELEXA) 20 MG tablet; Take 1 tablet (20 mg total) by mouth daily. -     buPROPion (WELLBUTRIN XL) 300 MG 24 hr tablet; Take 1 tablet (300 mg total) by mouth daily.     . Reviewed expectations re: course of current medical issues. . Discussed self-management of  symptoms. . Outlined signs and symptoms indicating need for more acute intervention. . Patient verbalized understanding and all questions were answered. . See orders for this visit as documented in the electronic medical record. . Patient received an After Visit Summary.  CMA or LPN served as scribe during this visit. History, Physical, and Plan performed by medical provider. The above documentation has been reviewed and is accurate and complete.   Jarold Motto, PA-C [place] [place] 11/27/2018  Follow-up: No follow-ups on file.

## 2018-11-27 NOTE — Patient Instructions (Signed)
It was great to see you!  Refills have been sent.  Take care,  Joshlyn Beadle PA-C  

## 2018-12-09 ENCOUNTER — Other Ambulatory Visit: Payer: Self-pay | Admitting: Family Medicine

## 2018-12-09 NOTE — Telephone Encounter (Signed)
Forwarding to you :)

## 2019-01-29 ENCOUNTER — Ambulatory Visit: Payer: Medicare Other | Admitting: Certified Nurse Midwife

## 2019-02-05 ENCOUNTER — Other Ambulatory Visit: Payer: Self-pay

## 2019-02-05 ENCOUNTER — Ambulatory Visit (INDEPENDENT_AMBULATORY_CARE_PROVIDER_SITE_OTHER): Payer: Medicare Other | Admitting: Certified Nurse Midwife

## 2019-02-05 ENCOUNTER — Other Ambulatory Visit (HOSPITAL_COMMUNITY)
Admission: RE | Admit: 2019-02-05 | Discharge: 2019-02-05 | Disposition: A | Discharge status: Home / Self Care | Payer: Medicare Other | Source: Ambulatory Visit | Attending: Certified Nurse Midwife | Admitting: Certified Nurse Midwife

## 2019-02-05 ENCOUNTER — Encounter: Payer: Self-pay | Admitting: Certified Nurse Midwife

## 2019-02-05 VITALS — BP 118/76 | HR 70 | Temp 97.7°F | Resp 16 | Ht 69.0 in | Wt 212.0 lb

## 2019-02-05 DIAGNOSIS — Z01419 Encounter for gynecological examination (general) (routine) without abnormal findings: Secondary | ICD-10-CM | POA: Diagnosis not present

## 2019-02-05 DIAGNOSIS — R87629 Unspecified abnormal cytological findings in specimens from vagina: Secondary | ICD-10-CM

## 2019-02-05 DIAGNOSIS — Z79890 Hormone replacement therapy (postmenopausal): Secondary | ICD-10-CM | POA: Diagnosis not present

## 2019-02-05 DIAGNOSIS — Z124 Encounter for screening for malignant neoplasm of cervix: Secondary | ICD-10-CM

## 2019-02-05 DIAGNOSIS — Z8742 Personal history of other diseases of the female genital tract: Secondary | ICD-10-CM | POA: Diagnosis not present

## 2019-02-05 HISTORY — DX: Unspecified abnormal cytological findings in specimens from vagina: R87.629

## 2019-02-05 MED ORDER — ESTRADIOL 1 MG PO TABS: 1.0000 mg | ORAL_TABLET | Freq: Every day | ORAL | 1 refills | Status: DC

## 2019-02-05 NOTE — Patient Instructions (Signed)

## 2019-02-05 NOTE — Progress Notes (Signed)
48 y.o. [id number] Single  African American Fe here for annual exam. Menopausal on ERT, working well. Desires continuance. Denies vaginal bleeding or vaginal dryness. Some weight gain since last visit of 7 pounds. History of abnormal pap smears and needs vaginal pap smear yearly.  Continues with follow up with Rheumatology with Sjogrens syndrome and with Specialist with Lupus and anemia. Staying stable with medications and no changes.  Estrace working well for hot flashes, night sweats occasional now. No other health issues today. Now on Medicare.  No LMP recorded. Patient has had a hysterectomy.          Sexually active: Yes.    The current method of family planning is status post hysterectomy.    Exercising: Yes.    walking Smoker:  no  Review of Systems  Constitutional: Negative.   HENT: Negative.   Eyes: Negative.   Respiratory: Negative.   Cardiovascular: Negative.   Gastrointestinal: Negative.   Genitourinary: Negative.   Musculoskeletal: Negative.   Skin: Negative.   Neurological: Negative.   Endo/Heme/Allergies: Negative.   Psychiatric/Behavioral: Negative.     Health Maintenance: Pap:  01-23-18 neg HPV HR neg History of Abnormal Pap: yes MMG:  06-26-18 category b density birads 1:neg Self Breast exams: yes Colonoscopy:   BMD:   Over 30yrs ago TDaP:  Within 10 per patient Shingles: no Pneumonia: no Hep C and HIV: HIV neg per patient Labs: PCP, Rhematology   reports that she has never smoked. She has never used smokeless tobacco. She reports current alcohol use of about 4.0 - 7.0 standard drinks of alcohol per week. She reports that she does not use drugs.  Past Medical History:  Diagnosis Date  . Atypical endocervical cells on Pap smear 05/23/2017  . Gestational diabetes   . Lupus (HCC)   . 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 Sjogren's syndrome (HCC) 05/23/2017    Past Surgical History:  Procedure Laterality Date  . CERVICAL BIOPSY  W/ LOOP ELECTRODE  EXCISION    . CESAREAN SECTION  1996, 1999  . COLPOSCOPY    . DILATION AND CURETTAGE OF UTERUS    . LAPAROTOMY  2008   x 2   . PELVIC LAPAROSCOPY    . TOTAL ABDOMINAL HYSTERECTOMY  2008  . TUBAL LIGATION      Current Outpatient Medications  Medication Sig Dispense Refill  . ALPRAZolam (XANAX) 0.5 MG tablet Take by mouth.    . azaTHIOprine (IMURAN) 50 MG tablet Take 150 mg by mouth daily.    . baclofen (LIORESAL) 20 MG tablet Take by mouth.    . Belimumab (BENLYSTA IV) Inject into the vein every 30 (thirty) days.    . BENLYSTA 200 MG/ML SOAJ INJECT THE CONTENTS OF 1 PEN INTO THE SKIN EVERY 7 DAYS  5  . buPROPion (WELLBUTRIN XL) 300 MG 24 hr tablet Take 1 tablet (300 mg total) by mouth daily. 90 tablet 1  . Cholecalciferol (VITAMIN D-1000 MAX ST) 25 MCG (1000 UT) tablet Take by mouth.    . citalopram (CELEXA) 20 MG tablet TAKE 1 TABLET BY MOUTH EVERY DAY 90 tablet 1  . dapsone 100 MG tablet Take 200 mg by mouth daily.    Marland Kitchen estradiol (ESTRACE) 1 MG tablet Take 1 tablet (1 mg total) by mouth daily. 90 tablet 1  . flurbiprofen (ANSAID) 100 MG tablet Take 300 mg by mouth daily.    Marland Kitchen gabapentin (NEURONTIN) 300 MG capsule Take 1,800 mg by mouth 3 (three) times daily.    Marland Kitchen  hydrocortisone 1 % ointment Apply to lesion up to 2x/day    . HYDROmorphone (DILAUDID) 2 MG tablet Take by mouth.    . hydroxychloroquine (PLAQUENIL) 200 MG tablet Take 400 mg by mouth daily.    . Immune Globulin, Human, (PRIVIGEN) 40 GM/400ML SOLN Inject into the vein.    . methadone (DOLOPHINE) 10 MG tablet Take 40 mg by mouth daily. Prescribed by [facility]    . methadone (DOLOPHINE) 10 MG tablet Take by mouth.    . pilocarpine (SALAGEN) 5 MG tablet Take by mouth.    . promethazine (PHENERGAN) 25 MG tablet Take by mouth.    . vitamin B-12 (CYANOCOBALAMIN) 1000 MCG tablet Take by mouth.     No current facility-administered medications for this visit.     Family History  Problem Relation Age of Onset  .  Heart disease Mother   . Hypertension Mother   . Heart disease Maternal Grandfather   . Heart attack Maternal Grandfather   . Diabetes Brother     ROS:  Pertinent items are noted in HPI.  Otherwise, a comprehensive ROS was negative.  Exam:   BP 118/76   Pulse 70   Temp 97.7 F (36.5 C) (Oral)   Resp 16   Ht 5\' 9"  (1.753 m)   Wt 212 lb (96.2 kg)   BMI 31.31 kg/m  Height: 5\' 9"  (175.3 cm) Ht Readings from Last 3 Encounters:  02/05/19 5\' 9"  (1.753 m)  11/27/18 5' 9.25" (1.759 m)  04/27/18 5' 9.25" (1.759 m)    General appearance: alert, cooperative and appears stated age Head: Normocephalic, without obvious abnormality, atraumatic Neck: no adenopathy, supple, symmetrical, trachea midline and thyroid normal to inspection and palpation Lungs: clear to auscultation bilaterally Breasts: normal appearance, no masses or tenderness, No nipple retraction or dimpling, No nipple discharge or bleeding, No axillary or supraclavicular adenopathy Heart: regular rate and rhythm Abdomen: soft, non-tender; no masses,  no organomegaly Extremities: extremities normal, atraumatic, no cyanosis or edema Skin: Skin color, texture, turgor normal. No rashes or lesions Lymph nodes: Cervical, supraclavicular, and axillary nodes normal. No abnormal inguinal nodes palpated Neurologic: Grossly normal   Pelvic: External genitalia:  no lesions              Urethra:  normal appearing urethra with no masses, tenderness or lesions              Bartholin's and Skene's: normal                 Vagina: normal appearing vagina with normal color and discharge, no lesions              Cervix: absent              Pap taken: Yes.   Bimanual Exam:  Uterus:  uterus absent              Adnexa: no mass, fullness, tenderness and adnexa absent               Rectovaginal: Confirms               Anus:  normal sphincter tone, no lesions  Chaperone present: yes  A:  Well Woman with normal exam  S/P TAH with BSO, due to  abnormal pap smears on ERT with good results  Plaquenil use for Sjogrens with Specialist follow  Lupus and hemolytic anemia stable per patient.  Colonoscopy candidate  P:   Reviewed health and wellness pertinent to  exam  Discussed risks/benefits/warning signs with ERT use, desires Rx.  Rx Estrace see order with instructions  Continue follow up with her health issues as MD recommended  Discussed risks/benefits of colonoscopy, patient will discuss with other providers and will advise if she needs our help with.  Pap smear: yes   counseled on breast self exam, mammography screening, feminine hygiene, adequate intake of calcium and vitamin D, diet and exercise  return annually or prn  An After Visit Summary was printed and given to the patient.

## 2019-02-08 LAB — CYTOLOGY - PAP

## 2019-02-12 ENCOUNTER — Other Ambulatory Visit: Payer: Self-pay | Admitting: Certified Nurse Midwife

## 2019-02-12 DIAGNOSIS — Z79890 Hormone replacement therapy (postmenopausal): Secondary | ICD-10-CM

## 2019-02-12 NOTE — Telephone Encounter (Signed)
Medication refill request: estrace 1mg   Last AEX:  02/05/19 DL Next AEX: 02/28/80 DL Last MMG (if hormonal medication request): 06/26/18 [id number]:neg  Refill authorized: 02/05/19 #[id number] to [facility]

## 2019-02-12 NOTE — Telephone Encounter (Signed)
Patient states she uses [facility] not [facility]. Rx refused.

## 2019-02-13 ENCOUNTER — Telehealth: Payer: Self-pay

## 2019-02-13 DIAGNOSIS — R87622 Low grade squamous intraepithelial lesion on cytologic smear of vagina (LGSIL): Secondary | ICD-10-CM

## 2019-02-13 NOTE — Telephone Encounter (Signed)
Spoke with patient. Results given. Patient verbalizes understanding. Patient states that she would like to wait for Leota Sauers CNM to return following Covid 19 crisis to proceed. Appointment scheduled for 03/26/2019 at 11:30 am with Leota Sauers CNM.   Instructions given. Motrin 800 mg po x , one hour before appointment with food. Make sure to eat a meal before appointment and drink plenty of fluids. Order placed. Patient verbalizes understanding. Encounter closed.

## 2019-02-13 NOTE — Telephone Encounter (Signed)
-----   Message from Romualdo Bolk, MD sent at 02/11/2019  9:53 AM EDT ----- Needs colposcopy, please inform

## 2019-03-26 ENCOUNTER — Ambulatory Visit: Payer: Medicare Other | Admitting: Obstetrics and Gynecology

## 2019-03-26 ENCOUNTER — Encounter: Payer: Self-pay | Admitting: Obstetrics and Gynecology

## 2019-03-26 ENCOUNTER — Ambulatory Visit: Payer: Medicare Other | Admitting: Certified Nurse Midwife

## 2019-03-26 ENCOUNTER — Other Ambulatory Visit: Payer: Self-pay

## 2019-03-26 VITALS — BP 136/88 | HR 70 | Resp 16 | Wt 216.0 lb

## 2019-03-26 DIAGNOSIS — R87629 Unspecified abnormal cytological findings in specimens from vagina: Secondary | ICD-10-CM | POA: Diagnosis not present

## 2019-03-26 NOTE — Progress Notes (Signed)
GYNECOLOGY  VISIT   HPI: 48 y.o.   Single Declined Not Hispanic or Latino  female   [phone] with No LMP recorded. Patient has had a hysterectomy.   here for vaginal colposcopy    02-05-2019 vaginal pap LGISL. H/O hysterectomy.  H/O abnormal vaginal pap smears and colposcopies when living in [place]. Never had any treatment.  She is immunocompromised.   GYNECOLOGIC HISTORY: No LMP recorded. Patient has had a hysterectomy. Contraception: hysterectomy Menopausal hormone therapy: estrace tablet        OB History    Gravida  3   Para      Term      Preterm      AB  1   Living  2     SAB      TAB      Ectopic  1   Multiple      Live Births                 Patient Active Problem List   Diagnosis Date Noted  . 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 Other forms of systemic lupus erythematosus (HCC) 05/23/2017  . 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 Sjogren's syndrome (HCC) 05/23/2017  . Atypical endocervical cells on Pap smear 05/23/2017    Past Medical History:  Diagnosis Date  . Atypical endocervical cells on Pap smear 05/23/2017  . Gestational diabetes   . Lupus (HCC)   . 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 Sjogren's syndrome (HCC) 05/23/2017    Past Surgical History:  Procedure Laterality Date  . CERVICAL BIOPSY  W/ LOOP ELECTRODE EXCISION    . CESAREAN SECTION  1996, 1999  . COLPOSCOPY    . DILATION AND CURETTAGE OF UTERUS    . LAPAROTOMY  2008   x 2   . PELVIC LAPAROSCOPY    . TOTAL ABDOMINAL HYSTERECTOMY  2008  . TUBAL LIGATION      Current Outpatient Medications  Medication Sig Dispense Refill  . ALPRAZolam (XANAX) 0.5 MG tablet Take by mouth.    . azaTHIOprine (IMURAN) 50 MG tablet Take 150 mg by mouth daily.    . baclofen (LIORESAL) 20 MG tablet Take by mouth.    . Belimumab (BENLYSTA IV) Inject into the vein every 30 (thirty) days.    . BENLYSTA 200 MG/ML SOAJ INJECT  THE CONTENTS OF 1 PEN INTO THE SKIN EVERY 7 DAYS  5  . buPROPion (WELLBUTRIN XL) 300 MG 24 hr tablet Take 1 tablet (300 mg total) by mouth daily. 90 tablet 1  . Cholecalciferol (VITAMIN D-1000 MAX ST) 25 MCG (1000 UT) tablet Take by mouth.    . citalopram (CELEXA) 20 MG tablet TAKE 1 TABLET BY MOUTH EVERY DAY 90 tablet 1  . dapsone 100 MG tablet Take 200 mg by mouth daily.    Marland Kitchen estradiol (ESTRACE) 1 MG tablet Take 1 tablet (1 mg total) by mouth daily. 90 tablet 1  . flurbiprofen (ANSAID) 100 MG tablet Take 300 mg by mouth daily.    Marland Kitchen gabapentin (NEURONTIN) 300 MG capsule Take 1,800 mg by mouth 3 (three) times daily.    . hydrocortisone 1 % ointment Apply to lesion up to 2x/day    . hydroxychloroquine (PLAQUENIL) 200 MG tablet Take 400 mg by mouth daily.    . Immune Globulin, Human, (PRIVIGEN) 40 GM/400ML SOLN Inject into the vein.    . methadone (DOLOPHINE) 10 MG tablet Take 40 mg  by mouth daily. Prescribed by [facility]    . methadone (DOLOPHINE) 10 MG tablet Take by mouth.    . pilocarpine (SALAGEN) 5 MG tablet Take by mouth.    . promethazine (PHENERGAN) 25 MG tablet Take by mouth.    . vitamin B-12 (CYANOCOBALAMIN) 1000 MCG tablet Take by mouth.     No current facility-administered medications for this visit.      ALLERGIES: Penicillins; Sulfa antibiotics; Tramadol; and Trazodone and nefazodone  Family History  Problem Relation Age of Onset  . Heart disease Mother   . Hypertension Mother   . Heart disease Maternal Grandfather   . Heart attack Maternal Grandfather   . Diabetes Brother     Social History   Socioeconomic History  . Marital status: Single    Spouse name: Not on file  . Number of children: Not on file  . Years of education: Not on file  . Highest education level: Not on file  Occupational History  . Not on file  Social Needs  . Financial resource strain: Not on file  . Food insecurity:    Worry: Not on file    Inability: Not on file  .  Transportation needs:    Medical: Not on file    Non-medical: Not on file  Tobacco Use  . Smoking status: Never Smoker  . Smokeless tobacco: Never Used  Substance and Sexual Activity  . Alcohol use: Yes    Alcohol/week: 4.0 - 7.0 standard drinks    Types: 4 - 7 Standard drinks or equivalent per week  . Drug use: No  . Sexual activity: Yes    Partners: Male    Birth control/protection: Surgical    Comment: hysterectomy  Lifestyle  . Physical activity:    Days per week: Not on file    Minutes per session: Not on file  . Stress: Not on file  Relationships  . Social connections:    Talks on phone: Not on file    Gets together: Not on file    Attends religious service: Not on file    Active member of club or organization: Not on file    Attends meetings of clubs or organizations: Not on file    Relationship status: Not on file  . Intimate partner violence:    Fear of current or ex partner: Not on file    Emotionally abused: Not on file    Physically abused: Not on file    Forced sexual activity: Not on file  Other Topics Concern  . Not on file  Social History Narrative  . Not on file    Review of Systems  Constitutional: Negative.   HENT: Negative.   Eyes: Negative.   Respiratory: Negative.   Cardiovascular: Negative.   Gastrointestinal: Negative.   Genitourinary: Negative.   Musculoskeletal: Negative.   Skin: Negative.   Neurological: Negative.   Endo/Heme/Allergies: Negative.   Psychiatric/Behavioral: Negative.     PHYSICAL EXAMINATION:    There were no vitals taken for this visit.    General appearance: alert, cooperative and appears stated age  Pelvic: External genitalia:  no lesions              Urethra:  normal appearing urethra with no masses, tenderness or lesions              Bartholins and Skenes: normal                 Vagina: normal appearing vagina with  normal color and discharge, no lesions              Cervix: no gross lesions                Colposcopy of entire vagina: no acetowhite changes. Decreased lugols uptake at the right vaginal apex, ~2 cm in diameter. Biopsy taken at the vaginal apex at 9 o'clock. No other abnormalities noted.   Chaperone was present for exam.  ASSESSMENT LSIL pap of vaginal apex, patient with autoimmune disorders    PLAN Vaginal colposcopy with biopsy  Further plan after results return Discussed HPV, vaginal dysplasia and need for continued f/u.    An After Visit Summary was printed and given to the patient.  In addition to the colposcopy ~10 minutes face to face time of which over 90% was spent in counseling.

## 2019-03-26 NOTE — Patient Instructions (Signed)

## 2019-04-02 ENCOUNTER — Other Ambulatory Visit: Payer: Self-pay | Admitting: *Deleted

## 2019-04-02 ENCOUNTER — Encounter: Payer: Self-pay | Admitting: Physician Assistant

## 2019-04-02 ENCOUNTER — Ambulatory Visit (INDEPENDENT_AMBULATORY_CARE_PROVIDER_SITE_OTHER): Payer: Medicare Other | Admitting: Physician Assistant

## 2019-04-02 ENCOUNTER — Telehealth: Payer: Self-pay

## 2019-04-02 DIAGNOSIS — F320 Major depressive disorder, single episode, mild: Secondary | ICD-10-CM | POA: Insufficient documentation

## 2019-04-02 DIAGNOSIS — F419 Anxiety disorder, unspecified: Secondary | ICD-10-CM | POA: Diagnosis not present

## 2019-04-02 MED ORDER — CITALOPRAM HYDROBROMIDE 20 MG PO TABS: 40.0000 mg | ORAL_TABLET | Freq: Every day | ORAL | 1 refills | Status: DC

## 2019-04-02 MED ORDER — BUPROPION HCL ER (XL) 300 MG PO TB24: 300.0000 mg | ORAL_TABLET | Freq: Every day | ORAL | 1 refills | Status: DC

## 2019-04-02 NOTE — Telephone Encounter (Signed)
Spoke with patient. Results given. Patient verbalizes understanding. 6 month pap scheduled for 10/10/2019 at 10:30 am with Dr.Jertson. Patient is agreeable to date and time. 6 month recall entered.   Routing to provider and will close encounter.

## 2019-04-02 NOTE — Telephone Encounter (Signed)
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 She is immunocompromised and needs a repeat pap in 6 months

## 2019-04-02 NOTE — Progress Notes (Signed)
Virtual Visit via Video   I connected with Kerri Young on 04/02/19 at 11:00 AM EDT by a video enabled telemedicine application and verified that I am speaking with the correct person using two identifiers. Location patient: Home Location provider: [facility], Office Persons participating in the virtual visit: J[place], [place], [place]   I discussed the limitations of evaluation and management by telemedicine and the availability of in person appointments. The patient expressed understanding and agreed to proceed.  I acted as a Neurosurgeon for [employer], PA-C [employer], LPN  Subjective:   HPI:  Anxiety and Depression Currently on Celexa 20 mg daily and Wellbutrin 300 mg daily. She is overall doing ok, however she has noticed that she is feeling a little restless and agitated at times during the pandemic. Denies SI/HI or trouble sleeping. She is considering an increase in her medication.  Depression screen [facility] 2/9 04/02/2019 11/27/2018 04/27/2018 05/23/2017  Decreased Interest 0 0 1 0  Down, Depressed, Hopeless 0 0 0 0  PHQ - 2 Score 0 0 1 0  Altered sleeping 1 0 1 -  Tired, decreased energy 1 0 2 -  Change in appetite 1 0 1 -  Feeling bad or failure about yourself  0 0 0 -  Trouble concentrating 1 0 1 -  Moving slowly or fidgety/restless 0 0 0 -  Suicidal thoughts 0 0 0 -  PHQ-9 Score 4 0 6 -  Difficult doing work/chores - Not difficult at all Somewhat difficult -   GAD 7 : Generalized Anxiety Score 04/02/2019 11/27/2018 04/27/2018  Nervous, Anxious, on Edge 1 1 1   Control/stop worrying 1 0 1  Worry too much - different things 1 0 1  Trouble relaxing 1 1 1   Restless 0 0 1  Easily annoyed or irritable 1 0 1  Afraid - awful might happen 1 0 1  Total GAD 7 Score 6 2 7   Anxiety Difficulty Not difficult at all Not difficult at all Somewhat difficult      ROS: See pertinent positives and negatives per HPI.  Patient Active Problem List   Diagnosis Date Noted  .  Depression, major, single episode, mild (HCC) 04/02/2019  . Anxiety 04/02/2019  . 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 Other forms of systemic lupus erythematosus (HCC) 05/23/2017  . 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 Sjogren's syndrome (HCC) 05/23/2017  . Atypical endocervical cells on Pap smear 05/23/2017    Social History   Tobacco Use  . Smoking status: Never Smoker  . Smokeless tobacco: Never Used  Substance Use Topics  . Alcohol use: Yes    Alcohol/week: 4.0 - 7.0 standard drinks    Types: 4 - 7 Standard drinks or equivalent per week    Current Outpatient Medications:  .  ALPRAZolam (XANAX) 0.5 MG tablet, Take 0.5 mg by mouth as needed. , Disp: , Rfl:  .  azaTHIOprine (IMURAN) 50 MG tablet, Take 150 mg by mouth daily., Disp: , Rfl:  .  baclofen (LIORESAL) 20 MG tablet, Take 20 mg by mouth daily. , Disp: , Rfl:  .  buPROPion (WELLBUTRIN XL) 300 MG 24 hr tablet, Take 1 tablet (300 mg total) by mouth daily., Disp: 90 tablet, Rfl: 1 .  citalopram (CELEXA) 20 MG tablet, Take 2 tablets (40 mg total) by mouth daily., Disp: 180 tablet, Rfl: 1 .  dapsone 100 MG tablet, Take 200 mg by mouth daily., Disp: , Rfl:  .  estradiol (ESTRACE) 1 MG tablet, Take 1 tablet (1 mg total) by mouth daily., Disp: 90 tablet, Rfl: 1 .  gabapentin (NEURONTIN) 300 MG capsule, Take 1,800 mg by mouth 3 (three) times daily., Disp: , Rfl:  .  hydrocortisone 1 % ointment, Apply to lesion up to 2x/day, Disp: , Rfl:  .  HYDROmorphone (DILAUDID) 2 MG tablet, Take 2 mg by mouth 3 (three) times daily as needed. , Disp: , Rfl:  .  hydroxychloroquine (PLAQUENIL) 200 MG tablet, Take 400 mg by mouth daily., Disp: , Rfl:  .  Immune Globulin, Human, (PRIVIGEN) 40 GM/400ML SOLN, Inject into the vein., Disp: , Rfl:  .  methadone (DOLOPHINE) 10 MG tablet, Take 40 mg by mouth daily. Prescribed by [facility], Disp: , Rfl:  .  nabumetone (RELAFEN) 500 MG  tablet, TAKE 1 TABLET BY MOUTH TWO  TIMES DAILY AS NEEDED, Disp: , Rfl:  .  pilocarpine (SALAGEN) 5 MG tablet, Take 5 mg by mouth as needed. , Disp: , Rfl:  .  promethazine (PHENERGAN) 25 MG tablet, Take 25 mg by mouth every 6 (six) hours as needed. , Disp: , Rfl:  .  BENLYSTA 200 MG/ML SOAJ, INJECT THE CONTENTS OF 1 PEN INTO THE SKIN EVERY 7 DAYS, Disp: , Rfl: 5  Allergies  Allergen Reactions  . Penicillins Other (See Comments)    Told not to take due to Lupus  . Sulfa Antibiotics Other (See Comments)    Told not to take due to Lupus  . Tramadol Other (See Comments)    Confusion and disorientation  . Trazodone And Nefazodone Hives    Objective:   VITALS: Per patient if applicable, see vitals. GENERAL: Alert, appears well and in no acute distress. HEENT: Atraumatic, conjunctiva clear, no obvious abnormalities on inspection of external nose and ears. NECK: Normal movements of the head and neck. CARDIOPULMONARY: No increased WOB. Speaking in clear sentences. I:E ratio WNL.  MS: Moves all visible extremities without noticeable abnormality. PSYCH: Pleasant and cooperative, well-groomed. Speech normal rate and rhythm. Affect is appropriate. Insight and judgement are appropriate. Attention is focused, linear, and appropriate.  NEURO: CN grossly intact. Oriented as arrived to appointment on time with no prompting. Moves both UE equally.  SKIN: No obvious lesions, wounds, erythema, or cyanosis noted on face or hands.  Assessment and Plan:   Diagnoses and all orders for this visit:  Depression, major, single episode, mild (HCC) and Anxiety Uncontrolled. Will increase Celexa to 40 mg daily. She is going to trial this and if she likes it she will continue. If she doesn't like it, she plans to remain at 20 mg daily. Continue Wellbutrin 300 mg daily. Follow-up in 6 months, sooner if concerns.  Other orders -     citalopram (CELEXA) 20 MG tablet; Take 2 tablets (40 mg total) by mouth daily. -      buPROPion (WELLBUTRIN XL) 300 MG 24 hr tablet; Take 1 tablet (300 mg total) by mouth daily.    . Reviewed expectations re: course of current medical issues. . Discussed self-management of symptoms. . Outlined signs and symptoms indicating need for more acute intervention. . Patient verbalized understanding and all questions were answered. Marland Kitchen Health Maintenance issues including appropriate healthy diet, exercise, and smoking avoidance were discussed with patient. . See orders for this visit as documented in the electronic medical record.  I discussed the assessment and treatment plan with the patient. The patient was provided an opportunity to ask questions and all  were answered. The patient agreed with the plan and demonstrated an understanding of the instructions.   The patient was advised to call back or seek an in-person evaluation if the symptoms worsen or if the condition fails to improve as anticipated.    Brown StationSamantha Kizzi Young, [place] 04/02/2019

## 2019-05-15 ENCOUNTER — Other Ambulatory Visit: Payer: Self-pay | Admitting: Obstetrics and Gynecology

## 2019-05-15 DIAGNOSIS — Z1231 Encounter for screening mammogram for malignant neoplasm of breast: Secondary | ICD-10-CM

## 2019-06-13 ENCOUNTER — Other Ambulatory Visit: Payer: Self-pay | Admitting: Certified Nurse Midwife

## 2019-06-13 DIAGNOSIS — Z79890 Hormone replacement therapy (postmenopausal): Secondary | ICD-10-CM

## 2019-06-28 ENCOUNTER — Other Ambulatory Visit: Payer: Self-pay

## 2019-06-28 ENCOUNTER — Ambulatory Visit
Admission: RE | Admit: 2019-06-28 | Discharge: 2019-06-28 | Disposition: A | Discharge status: Home / Self Care | Payer: Medicare Other | Source: Ambulatory Visit | Attending: Obstetrics and Gynecology | Admitting: Obstetrics and Gynecology

## 2019-06-28 DIAGNOSIS — Z1231 Encounter for screening mammogram for malignant neoplasm of breast: Secondary | ICD-10-CM

## 2019-08-07 ENCOUNTER — Other Ambulatory Visit: Payer: Self-pay | Admitting: Physician Assistant

## 2019-08-30 ENCOUNTER — Encounter: Payer: Self-pay | Admitting: Obstetrics and Gynecology

## 2019-08-30 ENCOUNTER — Other Ambulatory Visit: Payer: Self-pay | Admitting: Obstetrics and Gynecology

## 2019-08-30 DIAGNOSIS — Z79890 Hormone replacement therapy (postmenopausal): Secondary | ICD-10-CM

## 2019-08-30 MED ORDER — ESTRADIOL 1 MG PO TABS: 1.0000 mg | ORAL_TABLET | Freq: Every day | ORAL | 1 refills | Status: DC

## 2019-08-30 NOTE — Telephone Encounter (Signed)
Patient sent the following correspondence through [facility].    Can you please send in my refill for estradiol to [facility]  ASAP?

## 2019-08-30 NOTE — Telephone Encounter (Signed)
Medication refill request: Estradiol Last AEX:  02/05/19 JJ Next AEX: 02/06/20 Last MMG (if hormonal medication request): 06/28/19 BIRADS 1 negative/density b Refill authorized: Please advise on refill; Order pended #90 w/1 refill if authorized

## 2019-08-30 NOTE — Telephone Encounter (Signed)
Patient is calling to follow up regarding refill request. Patient stated that she will be out of medication next week.

## 2019-09-06 ENCOUNTER — Other Ambulatory Visit: Payer: Self-pay | Admitting: Physician Assistant

## 2019-10-01 ENCOUNTER — Telehealth: Payer: Self-pay | Admitting: Physician Assistant

## 2019-10-01 NOTE — Telephone Encounter (Signed)
I left a message asking the patient to call and schedule Medicare AWV with Courtney ([facility] Coach).  If patient calls back, please schedule Medicare Wellness Visit (initial) at next available opening.  VDM (Dee-Dee) 

## 2019-10-03 ENCOUNTER — Other Ambulatory Visit: Payer: Self-pay | Admitting: Physician Assistant

## 2019-10-09 NOTE — Progress Notes (Signed)
GYNECOLOGY  VISIT   HPI: 48 y.o.   Single Declined Not Hispanic or Latino  female   [phone] with No LMP recorded. Patient has had a hysterectomy.   here for 6 month pap.  H/O hysterectomy. 3/20 vaginal pap with LSIL. She is immunocompromised.  Colposcopy in 5/20 with VAIN I  GYNECOLOGIC HISTORY: No LMP recorded. Patient has had a hysterectomy. Contraception: Hysterectomy Menopausal hormone therapy: None        OB History    Gravida  3   Para      Term      Preterm      AB  1   Living  2     SAB      TAB      Ectopic  1   Multiple      Live Births                 Patient Active Problem List   Diagnosis Date Noted  . Depression, major, single episode, mild ([place]) 04/02/2019  . Anxiety 04/02/2019  . 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 Other forms of systemic lupus erythematosus ([place]) 05/23/2017  . Other autoimmune hemolytic anemias 05/23/2017  . Sjogren's syndrome ([place]) 05/23/2017  . Atypical endocervical cells on Pap smear 05/23/2017    Past Medical History:  Diagnosis Date  . Abnormal Pap smear of vagina 02/05/2019   LGSIL  . Atypical endocervical cells on Pap smear 05/23/2017  . Gestational diabetes   . Lupus ([place])   . Other autoimmune hemolytic anemias 05/23/2017  . Sjogren's syndrome ([place]) 05/23/2017    Past Surgical History:  Procedure Laterality Date  . CERVICAL BIOPSY  W/ LOOP ELECTRODE EXCISION    . [place]  . COLPOSCOPY    . DILATION AND CURETTAGE OF UTERUS    . LAPAROTOMY  2008   x 2   . PELVIC LAPAROSCOPY    . TOTAL ABDOMINAL HYSTERECTOMY  2008  . TUBAL LIGATION      Current Outpatient Medications  Medication Sig Dispense Refill  . ALPRAZolam (XANAX) 0.5 MG tablet Take 0.5 mg by mouth as needed.     Marland Kitchen azaTHIOprine (IMURAN) 50 MG tablet Take 150 mg by mouth daily.    . baclofen (LIORESAL) 20 MG tablet Take 20 mg by mouth daily.     . BENLYSTA 200 MG/ML SOAJ INJECT THE  CONTENTS OF 1 PEN INTO THE SKIN EVERY 7 DAYS  5  . buPROPion (WELLBUTRIN XL) 300 MG 24 hr tablet TAKE 1 TABLET BY MOUTH  DAILY 90 tablet 0  . citalopram (CELEXA) 20 MG tablet TAKE 2 TABLETS BY MOUTH  DAILY 180 tablet 0  . dapsone 100 MG tablet Take 200 mg by mouth daily.    Marland Kitchen estradiol (ESTRACE) 1 MG tablet Take 1 tablet (1 mg total) by mouth daily. 90 tablet 1  . gabapentin (NEURONTIN) 300 MG capsule Take 1,800 mg by mouth 3 (three) times daily.    . hydrocortisone 1 % ointment Apply to lesion up to 2x/day    . hydroxychloroquine (PLAQUENIL) 200 MG tablet Take 400 mg by mouth daily.    . Immune Globulin, Human, (PRIVIGEN) 40 GM/400ML SOLN Inject into the vein.    . methadone (DOLOPHINE) 10 MG tablet Take 40 mg by mouth daily. Prescribed by [facility]    . nabumetone (RELAFEN) 500 MG tablet TAKE 1 TABLET BY MOUTH TWO  TIMES DAILY AS NEEDED    .  pilocarpine (SALAGEN) 5 MG tablet Take 5 mg by mouth as needed.     . promethazine (PHENERGAN) 25 MG tablet Take 25 mg by mouth every 6 (six) hours as needed.      No current facility-administered medications for this visit.      ALLERGIES: Penicillins, Sulfa antibiotics, Tramadol, and Trazodone and nefazodone  Family History  Problem Relation Age of Onset  . Heart disease Mother   . Hypertension Mother   . Heart disease Maternal Grandfather   . Heart attack Maternal Grandfather   . Diabetes Brother     Social History   Socioeconomic History  . Marital status: Single    Spouse name: Not on file  . Number of children: Not on file  . Years of education: Not on file  . Highest education level: Not on file  Occupational History  . Not on file  Social Needs  . Financial resource strain: Not on file  . Food insecurity    Worry: Not on file    Inability: Not on file  . Transportation needs    Medical: Not on file    Non-medical: Not on file  Tobacco Use  . Smoking status: Never Smoker  . Smokeless tobacco: Never Used   Substance and Sexual Activity  . Alcohol use: Yes    Alcohol/week: 4.0 - 7.0 standard drinks    Types: 4 - 7 Standard drinks or equivalent per week  . Drug use: No  . Sexual activity: Yes    Partners: Male    Birth control/protection: Surgical    Comment: hysterectomy  Lifestyle  . Physical activity    Days per week: Not on file    Minutes per session: Not on file  . Stress: Not on file  Relationships  . Social Musician on phone: Not on file    Gets together: Not on file    Attends religious service: Not on file    Active member of club or organization: Not on file    Attends meetings of clubs or organizations: Not on file    Relationship status: Not on file  . Intimate partner violence    Fear of current or ex partner: Not on file    Emotionally abused: Not on file    Physically abused: Not on file    Forced sexual activity: Not on file  Other Topics Concern  . Not on file  Social History Narrative  . Not on file    Review of Systems  Constitutional: Negative.   HENT: Negative.   Eyes: Negative.   Respiratory: Negative.   Cardiovascular: Negative.   Gastrointestinal: Negative.   Genitourinary: Negative.   Musculoskeletal: Negative.   Skin: Negative.   Neurological: Negative.   Endo/Heme/Allergies: Negative.   Psychiatric/Behavioral: Negative.     PHYSICAL EXAMINATION:    BP 118/80 (BP Location: Right Arm, Patient Position: Sitting, Cuff Size: Large)   Pulse 96   Temp (!) 97.1 F (36.2 C) (Skin)   Wt 222 lb 6.4 oz (100.9 kg)   BMI 32.84 kg/m     General appearance: alert, cooperative and appears stated age   Pelvic: External genitalia:  no lesions              Urethra:  normal appearing urethra with no masses, tenderness or lesions              Bartholins and Skenes: normal  Vagina: normal appearing vagina with normal color and discharge, no lesions              Cervix:  no lesions  Chaperone was present for  exam.  ASSESSMENT H/o VAIN I, s/p hysterectomy Immunocompromised    PLAN Pap from vaginal apex   An After Visit Summary was printed and given to the patient.

## 2019-10-10 ENCOUNTER — Other Ambulatory Visit: Payer: Self-pay

## 2019-10-10 ENCOUNTER — Encounter: Payer: Self-pay | Admitting: Obstetrics and Gynecology

## 2019-10-10 ENCOUNTER — Ambulatory Visit (INDEPENDENT_AMBULATORY_CARE_PROVIDER_SITE_OTHER): Payer: Medicare Other | Admitting: Obstetrics and Gynecology

## 2019-10-10 ENCOUNTER — Other Ambulatory Visit (HOSPITAL_COMMUNITY)
Admission: RE | Admit: 2019-10-10 | Discharge: 2019-10-10 | Disposition: A | Discharge status: Home / Self Care | Payer: Medicare Other | Source: Ambulatory Visit | Attending: Obstetrics and Gynecology | Admitting: Obstetrics and Gynecology

## 2019-10-10 VITALS — BP 118/80 | HR 96 | Temp 97.1°F | Wt 222.4 lb

## 2019-10-10 DIAGNOSIS — Z1272 Encounter for screening for malignant neoplasm of vagina: Secondary | ICD-10-CM

## 2019-10-10 DIAGNOSIS — R87612 Low grade squamous intraepithelial lesion on cytologic smear of cervix (LGSIL): Secondary | ICD-10-CM | POA: Diagnosis not present

## 2019-10-10 DIAGNOSIS — Z87411 Personal history of vaginal dysplasia: Secondary | ICD-10-CM

## 2019-10-10 DIAGNOSIS — M329 Systemic lupus erythematosus, unspecified: Secondary | ICD-10-CM | POA: Diagnosis not present

## 2019-10-10 DIAGNOSIS — Z1151 Encounter for screening for human papillomavirus (HPV): Secondary | ICD-10-CM | POA: Insufficient documentation

## 2019-10-10 DIAGNOSIS — D591 Autoimmune hemolytic anemia, unspecified: Secondary | ICD-10-CM

## 2019-10-14 LAB — CYTOLOGY - PAP
Comment: NEGATIVE
High risk HPV: NEGATIVE

## 2019-10-15 ENCOUNTER — Telehealth: Payer: Self-pay

## 2019-10-15 DIAGNOSIS — R87622 Low grade squamous intraepithelial lesion on cytologic smear of vagina (LGSIL): Secondary | ICD-10-CM

## 2019-10-15 NOTE — Telephone Encounter (Signed)
-----   Message from Salvadore Dom, MD sent at 10/15/2019  4:09 PM EST ----- Please inform and set up for a vaginal colposcopy

## 2019-10-15 NOTE — Telephone Encounter (Signed)
Left message to call Ruthmary Occhipinti at [phone]. 

## 2019-10-22 NOTE — Telephone Encounter (Signed)
Left message to call Kaitlyn at [phone]. 

## 2019-10-23 ENCOUNTER — Telehealth: Payer: Self-pay | Admitting: Obstetrics and Gynecology

## 2019-10-23 NOTE — Telephone Encounter (Signed)
Left message to call Kaitlyn at [phone]. 

## 2019-10-23 NOTE — Telephone Encounter (Signed)
Patient returned call. Please call [phone]

## 2019-10-23 NOTE — Telephone Encounter (Signed)
Spoke with patient. Results given. Patient verbalizes understanding.  Colposcopy scheduled for 11/05/2019 at 11:30 am with Dr.Jertson.Instructions given. Patient agreeable and verbalized understanding of all instructions. Order placed for colposcopy. Encounter closed.

## 2019-10-23 NOTE — Telephone Encounter (Signed)
Call placed to convey benefits for colposcopy. Unable to leave a message voicemail is not setup.

## 2019-10-25 NOTE — Telephone Encounter (Signed)
Spoke with the patient and conveyed benefits. Patient understands/agreeable with the benefits. Patient is aware of the cancellation policy. Appointment scheduled 11/05/19.

## 2019-11-05 ENCOUNTER — Ambulatory Visit (INDEPENDENT_AMBULATORY_CARE_PROVIDER_SITE_OTHER): Payer: Medicare Other | Admitting: Obstetrics and Gynecology

## 2019-11-05 ENCOUNTER — Other Ambulatory Visit (HOSPITAL_COMMUNITY)
Admission: RE | Admit: 2019-11-05 | Discharge: 2019-11-05 | Disposition: A | Discharge status: Home / Self Care | Payer: Medicare Other | Source: Ambulatory Visit | Attending: Obstetrics and Gynecology | Admitting: Obstetrics and Gynecology

## 2019-11-05 ENCOUNTER — Encounter: Payer: Self-pay | Admitting: Obstetrics and Gynecology

## 2019-11-05 ENCOUNTER — Other Ambulatory Visit: Payer: Self-pay

## 2019-11-05 VITALS — BP 128/82 | HR 88 | Temp 97.2°F | Wt 220.0 lb

## 2019-11-05 DIAGNOSIS — R350 Frequency of micturition: Secondary | ICD-10-CM

## 2019-11-05 DIAGNOSIS — R87622 Low grade squamous intraepithelial lesion on cytologic smear of vagina (LGSIL): Secondary | ICD-10-CM

## 2019-11-05 DIAGNOSIS — N3090 Cystitis, unspecified without hematuria: Secondary | ICD-10-CM

## 2019-11-05 DIAGNOSIS — Z87411 Personal history of vaginal dysplasia: Secondary | ICD-10-CM | POA: Insufficient documentation

## 2019-11-05 DIAGNOSIS — R3915 Urgency of urination: Secondary | ICD-10-CM

## 2019-11-05 DIAGNOSIS — R8290 Unspecified abnormal findings in urine: Secondary | ICD-10-CM

## 2019-11-05 LAB — POCT URINALYSIS DIPSTICK
Bilirubin, UA: NEGATIVE
Blood, UA: POSITIVE
Glucose, UA: NEGATIVE
Ketones, UA: NEGATIVE
Nitrite, UA: POSITIVE
Protein, UA: NEGATIVE
Spec Grav, UA: 1.01 (ref 1.010–1.025)
Urobilinogen, UA: 0.2 E.U./dL
pH, UA: 5 (ref 5.0–8.0)

## 2019-11-05 MED ORDER — PHENAZOPYRIDINE HCL 200 MG PO TABS: 200.0000 mg | ORAL_TABLET | Freq: Three times a day (TID) | ORAL | 0 refills | Status: DC | PRN

## 2019-11-05 MED ORDER — NITROFURANTOIN MONOHYD MACRO 100 MG PO CAPS: 100.0000 mg | ORAL_CAPSULE | Freq: Two times a day (BID) | ORAL | 0 refills | Status: DC

## 2019-11-05 NOTE — Progress Notes (Signed)
GYNECOLOGY  VISIT   HPI: 48 y.o.   Single Declined Not Hispanic or Latino  female   [phone] with No LMP recorded. Patient has had a hysterectomy.   here for evaluation of an abnormal pap smear.  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 H/O hysterectomy, prior to her hysterectomy she had a LEEP for cervical dysplasia.  She c/o frequent urination, small amounts. Some urgency to void, some odor to her urine. No dysuria. Symptoms started last week. No flank pain, no fever.   GYNECOLOGIC HISTORY: No LMP recorded. Patient has had a hysterectomy. Contraception:hysterectomy Menopausal hormone therapy: none        OB History    Gravida  3   Para      Term      Preterm      AB  1   Living  2     SAB      TAB      Ectopic  1   Multiple      Live Births                 Patient Active Problem List   Diagnosis Date Noted  . Depression, major, single episode, mild ([place]) 04/02/2019  . Anxiety 04/02/2019  . 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 Other forms of systemic lupus erythematosus ([place]) 05/23/2017  . Other autoimmune hemolytic anemias 05/23/2017  . Sjogren's syndrome ([place]) 05/23/2017  . Atypical endocervical cells on Pap smear 05/23/2017    Past Medical History:  Diagnosis Date  . Abnormal Pap smear of vagina 02/05/2019   LGSIL  . Atypical endocervical cells on Pap smear 05/23/2017  . Gestational diabetes   . Lupus ([place])   . Other autoimmune hemolytic anemias 05/23/2017  . Sjogren's syndrome ([place]) 05/23/2017    Past Surgical History:  Procedure Laterality Date  . CERVICAL BIOPSY  W/ LOOP ELECTRODE EXCISION    . [place]  . COLPOSCOPY    . DILATION AND CURETTAGE OF UTERUS    . LAPAROTOMY  2008   x 2   . PELVIC LAPAROSCOPY    . TOTAL ABDOMINAL HYSTERECTOMY  2008  . TUBAL LIGATION      Current Outpatient Medications  Medication Sig Dispense Refill  . ALPRAZolam (XANAX) 0.5 MG tablet  Take 0.5 mg by mouth as needed.     Marland Kitchen azaTHIOprine (IMURAN) 50 MG tablet Take 150 mg by mouth daily.    . baclofen (LIORESAL) 20 MG tablet Take 20 mg by mouth daily.     . BENLYSTA 200 MG/ML SOAJ INJECT THE CONTENTS OF 1 PEN INTO THE SKIN EVERY 7 DAYS  5  . buPROPion (WELLBUTRIN XL) 300 MG 24 hr tablet TAKE 1 TABLET BY MOUTH  DAILY 90 tablet 0  . citalopram (CELEXA) 20 MG tablet TAKE 2 TABLETS BY MOUTH  DAILY 180 tablet 0  . dapsone 100 MG tablet Take 200 mg by mouth daily.    Marland Kitchen estradiol (ESTRACE) 1 MG tablet Take 1 tablet (1 mg total) by mouth daily. 90 tablet 1  . gabapentin (NEURONTIN) 300 MG capsule Take 1,800 mg by mouth 3 (three) times daily.    . hydrocortisone 1 % ointment Apply to lesion up to 2x/day    . hydroxychloroquine (PLAQUENIL) 200 MG tablet Take 400 mg by mouth daily.    . Immune Globulin, Human, (PRIVIGEN) 40 GM/400ML SOLN Inject into the vein.    . methadone (DOLOPHINE)  10 MG tablet Take 40 mg by mouth daily. Prescribed by [facility]    . nabumetone (RELAFEN) 500 MG tablet TAKE 1 TABLET BY MOUTH TWO  TIMES DAILY AS NEEDED    . pilocarpine (SALAGEN) 5 MG tablet Take 5 mg by mouth as needed.     . promethazine (PHENERGAN) 25 MG tablet Take 25 mg by mouth every 6 (six) hours as needed.      No current facility-administered medications for this visit.     ALLERGIES: Penicillins, Sulfa antibiotics, Tramadol, and Trazodone and nefazodone  Family History  Problem Relation Age of Onset  . Heart disease Mother   . Hypertension Mother   . Heart disease Maternal Grandfather   . Heart attack Maternal Grandfather   . Diabetes Brother     Social History   Socioeconomic History  . Marital status: Single    Spouse name: Not on file  . Number of children: Not on file  . Years of education: Not on file  . Highest education level: Not on file  Occupational History  . Not on file  Tobacco Use  . Smoking status: Never Smoker  . Smokeless tobacco: Never Used   Substance and Sexual Activity  . Alcohol use: Yes    Alcohol/week: 4.0 - 7.0 standard drinks    Types: 4 - 7 Standard drinks or equivalent per week  . Drug use: No  . Sexual activity: Yes    Partners: Male    Birth control/protection: Surgical    Comment: hysterectomy  Other Topics Concern  . Not on file  Social History Narrative  . Not on file   Social Determinants of Health   Financial Resource Strain:   . Difficulty of Paying Living Expenses: Not on file  Food Insecurity:   . Worried About Programme researcher, broadcasting/film/videounning Out of Food in the Last Year: Not on file  . Ran Out of Food in the Last Year: Not on file  Transportation Needs:   . Lack of Transportation (Medical): Not on file  . Lack of Transportation (Non-Medical): Not on file  Physical Activity:   . Days of Exercise per Week: Not on file  . Minutes of Exercise per Session: Not on file  Stress:   . Feeling of Stress : Not on file  Social Connections:   . Frequency of Communication with Friends and Family: Not on file  . Frequency of Social Gatherings with Friends and Family: Not on file  . Attends Religious Services: Not on file  . Active Member of Clubs or Organizations: Not on file  . Attends BankerClub or Organization Meetings: Not on file  . Marital Status: Not on file  Intimate Partner Violence:   . Fear of Current or Ex-Partner: Not on file  . Emotionally Abused: Not on file  . Physically Abused: Not on file  . Sexually Abused: Not on file    Review of Systems  Constitutional: Negative.   HENT: Negative.   Eyes: Negative.   Respiratory: Negative.   Cardiovascular: Negative.   Gastrointestinal: Negative.   Genitourinary:       Urine odor  Musculoskeletal: Negative.   Skin: Negative.   Neurological: Negative.   Endo/Heme/Allergies: Negative.   Psychiatric/Behavioral: Negative.     PHYSICAL EXAMINATION:    There were no vitals taken for this visit.    General appearance: alert, cooperative and appears stated  age  Pelvic: External genitalia:  no lesions  Urethra:  normal appearing urethra with no masses, tenderness or lesions              Bartholins and Skenes: normal                 Vagina: normal appearing vagina with normal color and discharge, no lesions              Cervix: absent               Colposcopy: no acetowhite changes. Decreased lugols uptake in a few areas on the right vaginal side wall. Biopsy taken at 9 and 10 o'clock   Chaperone was present for exam.  ASSESSMENT LSIL pap, h/o VAIN I UTI    PLAN Vaginal biopsies done Send urine for ua, c&s Treat with macrobid and pyridium   An After Visit Summary was printed and given to the patient.

## 2019-11-05 NOTE — Patient Instructions (Signed)
Colposcopy Post-procedure Instructions . Cramping is common.  You may take Ibuprofen, Aleve, or Tylenol for the cramping.  This should resolve within the next two to three days.   . You may have bright red spotting or blackish discharge for several days after your procedure.  The discharge occurs because of a topical solution used to stop bleeding at the biopsy site(s).  You should wear a mini pad for the next few days. . Refrain from putting anything in the vagina until the bleeding and/or discharge stops (usually less than a week). . You need to call the office if you have any pelvic pain, fever, heavy bleeding, or foul smelling vaginal discharge. . Shower or bathe as normal . You will be notified within one week of your biopsy results or we will discuss your results at your follow-up appointment if needed.  Urinary Tract Infection, Adult  A urinary tract infection (UTI) is an infection of any part of the urinary tract. The urinary tract includes the kidneys, ureters, bladder, and urethra. These organs make, store, and get rid of urine in the body. Your health care provider may use other names to describe the infection. An upper UTI affects the ureters and kidneys (pyelonephritis). A lower UTI affects the bladder (cystitis) and urethra (urethritis). What are the causes? Most urinary tract infections are caused by bacteria in your genital area, around the entrance to your urinary tract (urethra). These bacteria grow and cause inflammation of your urinary tract. What increases the risk? You are more likely to develop this condition if:  You have a urinary catheter that stays in place (indwelling).  You are not able to control when you urinate or have a bowel movement (you have incontinence).  You are female and you: ? Use a spermicide or diaphragm for birth control. ? Have low estrogen levels. ? Are pregnant.  You have certain genes that increase your risk (genetics).  You are sexually  active.  You take antibiotic medicines.  You have a condition that causes your flow of urine to slow down, such as: ? An enlarged prostate, if you are female. ? Blockage in your urethra (stricture). ? A kidney stone. ? A nerve condition that affects your bladder control (neurogenic bladder). ? Not getting enough to drink, or not urinating often.  You have certain medical conditions, such as: ? Diabetes. ? A weak disease-fighting system (immunesystem). ? Sickle cell disease. ? Gout. ? Spinal cord injury. What are the signs or symptoms? Symptoms of this condition include:  Needing to urinate right away (urgently).  Frequent urination or passing small amounts of urine frequently.  Pain or burning with urination.  Blood in the urine.  Urine that smells bad or unusual.  Trouble urinating.  Cloudy urine.  Vaginal discharge, if you are female.  Pain in the abdomen or the lower back. You may also have:  Vomiting or a decreased appetite.  Confusion.  Irritability or tiredness.  A fever.  Diarrhea. The first symptom in older adults may be confusion. In some cases, they may not have any symptoms until the infection has worsened. How is this diagnosed? This condition is diagnosed based on your medical history and a physical exam. You may also have other tests, including:  Urine tests.  Blood tests.  Tests for sexually transmitted infections (STIs). If you have had more than one UTI, a cystoscopy or imaging studies may be done to determine the cause of the infections. How is this treated? Treatment for this  condition includes:  Antibiotic medicine.  Over-the-counter medicines to treat discomfort.  Drinking enough water to stay hydrated. If you have frequent infections or have other conditions such as a kidney stone, you may need to see a health care provider who specializes in the urinary tract (urologist). In rare cases, urinary tract infections can cause sepsis.  Sepsis is a life-threatening condition that occurs when the body responds to an infection. Sepsis is treated in the hospital with IV antibiotics, fluids, and other medicines. Follow these instructions at home:  Medicines  Take over-the-counter and prescription medicines only as told by your health care provider.  If you were prescribed an antibiotic medicine, take it as told by your health care provider. Do not stop using the antibiotic even if you start to feel better. General instructions  Make sure you: ? Empty your bladder often and completely. Do not hold urine for long periods of time. ? Empty your bladder after sex. ? Wipe from front to back after a bowel movement if you are female. Use each tissue one time when you wipe.  Drink enough fluid to keep your urine pale yellow.  Keep all follow-up visits as told by your health care provider. This is important. Contact a health care provider if:  Your symptoms do not get better after 1-2 days.  Your symptoms go away and then return. Get help right away if you have:  Severe pain in your back or your lower abdomen.  A fever.  Nausea or vomiting. Summary  A urinary tract infection (UTI) is an infection of any part of the urinary tract, which includes the kidneys, ureters, bladder, and urethra.  Most urinary tract infections are caused by bacteria in your genital area, around the entrance to your urinary tract (urethra).  Treatment for this condition often includes antibiotic medicines.  If you were prescribed an antibiotic medicine, take it as told by your health care provider. Do not stop using the antibiotic even if you start to feel better.  Keep all follow-up visits as told by your health care provider. This is important. This information is not intended to replace advice given to you by your health care provider. Make sure you discuss any questions you have with your health care provider. Document Released: 08/17/2005  Document Revised: 10/25/2018 Document Reviewed: 05/17/2018 Elsevier Patient Education  2020 [employer].

## 2019-11-06 LAB — URINALYSIS, MICROSCOPIC ONLY
Casts: NONE SEEN /lpf
WBC, UA: >30 /hpf — AB (ref 0–5)

## 2019-11-07 LAB — URINE CULTURE

## 2019-11-08 LAB — SURGICAL PATHOLOGY

## 2019-11-26 ENCOUNTER — Other Ambulatory Visit: Payer: Self-pay | Admitting: Physician Assistant

## 2019-12-23 ENCOUNTER — Other Ambulatory Visit: Payer: Self-pay | Admitting: Physician Assistant

## 2020-01-09 ENCOUNTER — Telehealth: Payer: Self-pay | Admitting: Physician Assistant

## 2020-01-09 NOTE — Telephone Encounter (Signed)
I left a message asking the pt to call and schedule AWV-I with Toni Amend and follow up visit with Jarold Motto.

## 2020-01-20 ENCOUNTER — Other Ambulatory Visit: Payer: Self-pay | Admitting: Obstetrics and Gynecology

## 2020-01-20 DIAGNOSIS — Z79890 Hormone replacement therapy (postmenopausal): Secondary | ICD-10-CM

## 2020-01-22 ENCOUNTER — Telehealth: Payer: Self-pay | Admitting: Obstetrics and Gynecology

## 2020-01-22 DIAGNOSIS — Z79890 Hormone replacement therapy (postmenopausal): Secondary | ICD-10-CM

## 2020-01-22 NOTE — Telephone Encounter (Signed)
Patient is scheduled for aex 3/16 but had colposcopy on 12/15. Should she keep  appointment for aex?

## 2020-01-22 NOTE — Telephone Encounter (Signed)
02/05/19 PAP: LSIL 03/26/19 Colpo:  VAIN I 10/10/19 PAP: LSIL 11/05/19 Colpo: negative  Last AEX 02/05/19 Next: 02/04/20  Dr. Oscar La -please review and advise if AEX should be rescheduled to a later date.

## 2020-01-23 NOTE — Telephone Encounter (Signed)
Left message to call Keyshun Elpers, RN at [facility] [phone].   

## 2020-01-23 NOTE — Telephone Encounter (Signed)
She isn't due for a pap until 11/21. I would just reschedule her until then. Please remind her that her mammogram is due in 8/21.

## 2020-01-27 NOTE — Telephone Encounter (Signed)
Left message to call Mylan Schwarz, RN at [facility] [phone].   

## 2020-01-29 MED ORDER — ESTRADIOL 1 MG PO TABS: 1.0000 mg | ORAL_TABLET | Freq: Every day | ORAL | 2 refills | Status: DC

## 2020-01-29 NOTE — Telephone Encounter (Signed)
Rx pended for Estradiol 1mg  tab PO daily #90/1RF.   Routing to Dr. 

## 2020-01-29 NOTE — Telephone Encounter (Signed)
Patient scheduled for aex 10/29/20. She is aware of mammogram due in August. She would like a refill for estradiol sent to [facility].

## 2020-02-06 ENCOUNTER — Ambulatory Visit: Payer: Medicare Other | Admitting: Certified Nurse Midwife

## 2020-02-06 ENCOUNTER — Ambulatory Visit: Payer: Medicare Other | Admitting: Obstetrics and Gynecology

## 2020-02-07 ENCOUNTER — Encounter: Payer: Self-pay | Admitting: Certified Nurse Midwife

## 2020-02-28 ENCOUNTER — Telehealth: Payer: Self-pay | Admitting: Obstetrics and Gynecology

## 2020-02-28 NOTE — Telephone Encounter (Signed)
Patient calling because estradiol was supposed to called in to [facility].

## 2020-02-28 NOTE — Telephone Encounter (Signed)
RX estradiol 1 mg PO tab #90/2RF sent on 01/29/20.   Call to patient. Patient states no RX on file, she has checked with [facility], nothing received. Advised patient RN will contact [facility] and return call. Patient agreeable.   Call placed to [facility], spoke with Caryn Bee. Was advised Rx estradiol 1 mg PO tab was received and on file. Medication was shipped to address on file on 02/20/20, delivered on 02/24/20 at 11:58am. Patient should contact [facility] customer service to further discuss option of resend.   Call returned to patient, advised as seen above. Patient states she has been out of town and not checked her mail. Questions answered, no additional Rx needed.   Encounter closed.

## 2020-02-28 NOTE — Telephone Encounter (Signed)
Patient called to follow up on medication refill.

## 2020-05-01 ENCOUNTER — Other Ambulatory Visit: Payer: Self-pay | Admitting: Physician Assistant

## 2020-05-15 ENCOUNTER — Other Ambulatory Visit: Payer: Self-pay | Admitting: Obstetrics and Gynecology

## 2020-05-15 DIAGNOSIS — Z1231 Encounter for screening mammogram for malignant neoplasm of breast: Secondary | ICD-10-CM

## 2020-06-30 ENCOUNTER — Other Ambulatory Visit: Payer: Self-pay

## 2020-06-30 ENCOUNTER — Ambulatory Visit
Admission: RE | Admit: 2020-06-30 | Discharge: 2020-06-30 | Disposition: A | Discharge status: Home / Self Care | Payer: Medicare Other | Source: Ambulatory Visit | Attending: Obstetrics and Gynecology | Admitting: Obstetrics and Gynecology

## 2020-06-30 DIAGNOSIS — Z1231 Encounter for screening mammogram for malignant neoplasm of breast: Secondary | ICD-10-CM

## 2020-09-23 ENCOUNTER — Other Ambulatory Visit: Payer: Self-pay | Admitting: Obstetrics and Gynecology

## 2020-09-23 DIAGNOSIS — Z79890 Hormone replacement therapy (postmenopausal): Secondary | ICD-10-CM

## 2020-09-24 NOTE — Telephone Encounter (Signed)
Medication refill request: Estradiol 1mg   Last OV:  12/11/25/18 Next AEX: 10/29/20  Last MMG (if hormonal medication request): 06/30/20  Neg  Refill authorized: 90/0

## 2020-09-24 NOTE — Telephone Encounter (Signed)
Please have the patient make sure that her Rheumatologist is okay with her being on ERT. ERT increases her risk of blood clots, stroke, MI and breast cancer. Lupus can also increase her risk of blood clots. Her risk could be decreased some with use of transdermal estrogen (ie a patch), see if she is willing to try this. She definitely shouldn't be on ERT if she has had +antiphospholipid antibodies. I don't see the lab in care everywhere but there is a comment in her Rheumatologists note  Aug 2018: neg APS profile  If she is willing to try transdermal estrogen, the equivalent dose is vivelle dot 0.05 mg, apply topically, change 2 x a week. If not continue on the oral ERT until her f/u visit.

## 2020-09-25 NOTE — Telephone Encounter (Signed)
Spoke with pt. Pt given update and recommendations per Dr Oscar La for ERT. Pt states has recently seen Rheumatologist and states they are aware of ERT and ok to stay on as prescribed. Pt states is aware of risks and ok to stay on Rx.  Pt is not out of Rx, has 1-2 weeks left.  Pharmacy verified.  Rx sent. # 90, 0RF  Pt has f/u OV on 10/29/20 Routing to Dr Oscar La for update and review Encounter closed

## 2020-10-29 ENCOUNTER — Ambulatory Visit (INDEPENDENT_AMBULATORY_CARE_PROVIDER_SITE_OTHER): Payer: Medicare Other | Admitting: Obstetrics and Gynecology

## 2020-10-29 ENCOUNTER — Encounter: Payer: Self-pay | Admitting: Obstetrics and Gynecology

## 2020-10-29 ENCOUNTER — Other Ambulatory Visit (HOSPITAL_COMMUNITY)
Admission: RE | Admit: 2020-10-29 | Discharge: 2020-10-29 | Disposition: A | Discharge status: Home / Self Care | Payer: Medicare Other | Source: Ambulatory Visit | Attending: Obstetrics and Gynecology | Admitting: Obstetrics and Gynecology

## 2020-10-29 ENCOUNTER — Other Ambulatory Visit: Payer: Self-pay

## 2020-10-29 VITALS — BP 130/76 | HR 83 | Ht 68.0 in | Wt 214.8 lb

## 2020-10-29 DIAGNOSIS — Z5181 Encounter for therapeutic drug level monitoring: Secondary | ICD-10-CM

## 2020-10-29 DIAGNOSIS — Z87411 Personal history of vaginal dysplasia: Secondary | ICD-10-CM

## 2020-10-29 DIAGNOSIS — Z1272 Encounter for screening for malignant neoplasm of vagina: Secondary | ICD-10-CM | POA: Diagnosis not present

## 2020-10-29 DIAGNOSIS — Z01419 Encounter for gynecological examination (general) (routine) without abnormal findings: Secondary | ICD-10-CM

## 2020-10-29 DIAGNOSIS — R87622 Low grade squamous intraepithelial lesion on cytologic smear of vagina (LGSIL): Secondary | ICD-10-CM | POA: Insufficient documentation

## 2020-10-29 DIAGNOSIS — Z79890 Hormone replacement therapy (postmenopausal): Secondary | ICD-10-CM

## 2020-10-29 DIAGNOSIS — Z1151 Encounter for screening for human papillomavirus (HPV): Secondary | ICD-10-CM | POA: Diagnosis not present

## 2020-10-29 MED ORDER — ESTRADIOL 1 MG PO TABS: 1.0000 mg | ORAL_TABLET | Freq: Every day | ORAL | 3 refills | Status: DC

## 2020-10-29 NOTE — Patient Instructions (Signed)

## 2020-10-29 NOTE — Progress Notes (Signed)
49 y.o. [id number] Single Declined Not Hispanic or Latino female here for annual exam.  H/O hysterectomy, H/O VAIN I.    H/O abnormal vaginal pap smears and colposcopies when living in [place]. Never had any treatment.  She is immunocompromised.  She is on oral ERT, has had irritation from other patches in the past. Doing well on the ERT, tolerable vasomotor symptoms. Warm all the time. Sexually active, no pain.  No bowel or bladder issues.   No LMP recorded. Patient has had a hysterectomy.          Sexually active: Yes.    The current method of family planning is status post hysterectomy.    Exercising: Yes.    Walking  Smoker:  no  Health Maintenance: Pap:10/10/19 LISIL Hr HPV Neg, 01-23-18 neg HPV HR neg   History of abnormal Pap:  Yes Colpo done Benign  MMG:  06/30/20 Density B Bi-Rads 1 Neg  BMD:   Over 10 years  Colonoscopy: Never  TDaP:  With in 10 years  Gardasil: none    reports that she has never smoked. She has never used smokeless tobacco. She reports current alcohol use of about 4.0 - 7.0 standard drinks of alcohol per week. She reports that she does not use drugs. Disabled with the lupus. Kids are 25 and 22.   Past Medical History:  Diagnosis Date  . Abnormal Pap smear of vagina 02/05/2019   LGSIL  . Atypical endocervical cells on Pap smear 05/23/2017  . Gestational diabetes   . Lupus ([facility])   . Other autoimmune hemolytic anemias 05/23/2017  . Sjogren's syndrome ([facility]) 05/23/2017    Past Surgical History:  Procedure Laterality Date  . CERVICAL BIOPSY  W/ LOOP ELECTRODE EXCISION    . CESAREAN SECTION  1996, 1999  . COLPOSCOPY    . DILATION AND CURETTAGE OF UTERUS    . LAPAROTOMY  2008   x 2   . PELVIC LAPAROSCOPY    . TOTAL ABDOMINAL HYSTERECTOMY  2008  . TUBAL LIGATION      Current Outpatient Medications  Medication Sig Dispense Refill  . ALPRAZolam (XANAX) 0.5 MG tablet Take 0.5 mg by mouth as needed.     Marland Kitchen azaTHIOprine (IMURAN) 50 MG tablet Take 150 mg by mouth daily.     . baclofen (LIORESAL) 20 MG tablet Take 20 mg by mouth daily.     . BENLYSTA 200 MG/ML SOAJ INJECT THE CONTENTS OF 1 PEN INTO THE SKIN EVERY 7 DAYS  5  . buPROPion (WELLBUTRIN XL) 300 MG 24 hr tablet TAKE 1 TABLET BY MOUTH  DAILY 90 tablet 3  . citalopram (CELEXA) 20 MG tablet TAKE 2 TABLETS BY MOUTH  DAILY 180 tablet 3  . dapsone 100 MG tablet Take 200 mg by mouth daily.    Marland Kitchen estradiol (ESTRACE) 1 MG tablet TAKE 1 TABLET BY MOUTH  DAILY 90 tablet 0  . gabapentin (NEURONTIN) 300 MG capsule Take 1,800 mg by mouth 3 (three) times daily.    . hydrocortisone 1 % ointment Apply to lesion up to 2x/day    . hydroxychloroquine (PLAQUENIL) 200 MG tablet Take 400 mg by mouth daily.    . Immune Globulin, Human, 40 GM/400ML SOLN Inject into the vein.    . methadone (DOLOPHINE) 10 MG tablet Take 40 mg by mouth daily. Prescribed by [facility]    . nabumetone (RELAFEN) 500 MG tablet TAKE 1 TABLET BY MOUTH TWO  TIMES DAILY AS NEEDED    .  nitrofurantoin, macrocrystal-monohydrate, (MACROBID) 100 MG capsule Take 1 capsule (100 mg total) by mouth 2 (two) times daily. 10 capsule 0  . phenazopyridine (PYRIDIUM) 200 MG tablet Take 1 tablet (200 mg total) by mouth 3 (three) times daily as needed. 6 tablet 0  . pilocarpine (SALAGEN) 5 MG tablet Take 5 mg by mouth as needed.     . promethazine (PHENERGAN) 25 MG tablet Take 25 mg by mouth every 6 (six) hours as needed.      No current facility-administered medications for this visit.    Family History  Problem Relation Age of Onset  . Heart disease Mother   . Hypertension Mother   . Heart disease Maternal Grandfather   . Heart attack Maternal Grandfather   . Diabetes Brother     Review of Systems  All other systems reviewed and are negative.   Exam:   BP 130/76   Pulse 83   Ht 5\' 8"  (1.727 m)   Wt 214 lb 12.8 oz (97.4 kg)   SpO2 98%   BMI 32.66 kg/m   Weight change: @WEIGHTCHANGE @ Height:   Height: 5\' 8"  (172.7 cm)  Ht Readings from Last 3  Encounters:  10/29/20 5\' 8"  (1.727 m)  02/05/19 5\' 9"  (1.753 m)  11/27/18 5' 9.25" (1.759 m)    General appearance: alert, cooperative and appears stated age Head: Normocephalic, without obvious abnormality, atraumatic Neck: no adenopathy, supple, symmetrical, trachea midline and thyroid normal to inspection and palpation Lungs: clear to auscultation bilaterally Cardiovascular: regular rate and rhythm Breasts: normal appearance, no masses or tenderness Abdomen: soft, non-tender; non distended,  no masses,  no organomegaly Extremities: extremities normal, atraumatic, no cyanosis or edema Skin: Skin color, texture, turgor normal. No rashes or lesions Lymph nodes: Cervical, supraclavicular, and axillary nodes normal. No abnormal inguinal nodes palpated Neurologic: Grossly normal   Pelvic: External genitalia:  no lesions              Urethra:  normal appearing urethra with no masses, tenderness or lesions              Bartholins and Skenes: normal                 Vagina: normal appearing vagina with normal color and discharge, no lesions              Cervix: absent               Bimanual Exam:  Uterus:  uterus absent              Adnexa: no mass, fullness, tenderness               Rectovaginal: Confirms               Anus:  normal sphincter tone, no lesions  chaperoned for the exam.  A:  Well Woman with normal exam  H/O hysterectomy  H/O VAIN I  On ERT, aware of risks, declines transdermal (didn't tolerate other transdermal medication  P:   Pap with hpv  Discussed breast self exam  Discussed calcium and vit D intake  Labs with rheumatology  Mammogram UTD

## 2020-10-31 LAB — CYTOLOGY - PAP
Comment: NEGATIVE
High risk HPV: NEGATIVE

## 2020-11-04 ENCOUNTER — Telehealth: Payer: Self-pay

## 2020-11-04 NOTE — Telephone Encounter (Signed)
Left message for pt to return call to triage RN. 

## 2020-11-04 NOTE — Telephone Encounter (Signed)
-----   Message from Jill Evelyn Jertson, MD sent at 11/04/2020 12:52 PM EST ----- Please let the patient know that her pap returned with LSIL again and set her up for another colposcopy 

## 2020-11-09 NOTE — Telephone Encounter (Signed)
Left message for pt to return call to triage RN. 

## 2020-11-19 ENCOUNTER — Other Ambulatory Visit: Payer: Self-pay | Admitting: Physician Assistant

## 2020-12-01 ENCOUNTER — Telehealth: Payer: Self-pay | Admitting: *Deleted

## 2020-12-01 NOTE — Telephone Encounter (Signed)
Isabell Jarvis, RN  11/09/2020 2:40 PM EST Back to Top     LM 12/20   Isabell Jarvis, RN  11/04/2020 4:34 PM EST      LM 12/15

## 2020-12-01 NOTE — Telephone Encounter (Signed)
See [facility] telephone encounter dated 12/01/20.   Encounter closed.

## 2020-12-01 NOTE — Telephone Encounter (Signed)
-----   Message from Romualdo Bolk, MD sent at 11/04/2020 12:52 PM EST ----- Please let the patient know that her pap returned with LSIL again and set her up for another colposcopy

## 2020-12-01 NOTE — Telephone Encounter (Signed)
Left message to call Triage at [facility], [phone].  See results below.

## 2020-12-15 ENCOUNTER — Encounter: Payer: Self-pay | Admitting: Obstetrics and Gynecology

## 2020-12-15 NOTE — Telephone Encounter (Signed)
Left message to call triage at [facility], [phone].  

## 2020-12-16 NOTE — Telephone Encounter (Signed)
Patient needs C&B with Dr. Reyne Dumas

## 2020-12-16 NOTE — Telephone Encounter (Signed)
Patient scheduled on 12/22/20, informed via my chart regarding C&B.

## 2020-12-22 ENCOUNTER — Encounter: Payer: Self-pay | Admitting: Obstetrics and Gynecology

## 2020-12-22 ENCOUNTER — Ambulatory Visit: Payer: Medicare Other | Admitting: Obstetrics and Gynecology

## 2020-12-22 ENCOUNTER — Other Ambulatory Visit: Payer: Self-pay

## 2020-12-22 ENCOUNTER — Other Ambulatory Visit (HOSPITAL_COMMUNITY)
Admission: RE | Admit: 2020-12-22 | Discharge: 2020-12-22 | Disposition: A | Discharge status: Home / Self Care | Payer: Medicare Other | Source: Ambulatory Visit | Attending: Obstetrics and Gynecology | Admitting: Obstetrics and Gynecology

## 2020-12-22 VITALS — BP 144/80 | HR 80 | Ht 69.0 in | Wt 217.0 lb

## 2020-12-22 DIAGNOSIS — R87622 Low grade squamous intraepithelial lesion on cytologic smear of vagina (LGSIL): Secondary | ICD-10-CM

## 2020-12-22 NOTE — Progress Notes (Signed)
GYNECOLOGY  VISIT   HPI: 50 y.o.   Single Declined Not Hispanic or Latino  female   [phone] with No LMP recorded. Patient has had a hysterectomy.   here for a colposcopy. H/O hysterectomy, prior to her hysterectomy she had a LEEP for cervical dysplasia.   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 Patient has had a hysterectomy. Contraception:Hysterectomy Menopausal hormone therapy: Estradiol        OB History    Gravida  3   Para      Term      Preterm      AB  1   Living  2     SAB      IAB      Ectopic  1   Multiple      Live Births                 Patient Active Problem List   Diagnosis Date Noted  . Depression, major, single episode, mild (HCC) 04/02/2019  . Anxiety 04/02/2019  . 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 Other forms of systemic lupus erythematosus (HCC) 05/23/2017  . Other autoimmune hemolytic anemias 05/23/2017  . Sjogren's syndrome (HCC) 05/23/2017  . Atypical endocervical cells on Pap smear 05/23/2017    Past Medical History:  Diagnosis Date  . Abnormal Pap smear of vagina 02/05/2019   LGSIL  . Atypical endocervical cells on Pap smear 05/23/2017  . Gestational diabetes   . Lupus (HCC)   . Other autoimmune hemolytic anemias 05/23/2017  . Sjogren's syndrome (HCC) 05/23/2017    Past Surgical History:  Procedure Laterality Date  . CERVICAL BIOPSY  W/ LOOP ELECTRODE EXCISION    . CESAREAN SECTION  1996, 1999  . COLPOSCOPY    . DILATION AND CURETTAGE OF UTERUS    . LAPAROTOMY  2008   x 2   . PELVIC LAPAROSCOPY    . TOTAL ABDOMINAL HYSTERECTOMY  2008  . TUBAL LIGATION      Current Outpatient Medications  Medication Sig Dispense Refill  . ALPRAZolam (XANAX) 0.5 MG tablet Take 0.5 mg by mouth as needed.     Marland Kitchen azaTHIOprine  (IMURAN) 50 MG tablet Take 150 mg by mouth daily.    . baclofen (LIORESAL) 20 MG tablet Take 20 mg by mouth daily.     . BENLYSTA 200 MG/ML SOAJ INJECT THE CONTENTS OF 1 PEN INTO THE SKIN EVERY 7 DAYS  5  . buPROPion (WELLBUTRIN XL) 300 MG 24 hr tablet TAKE 1 TABLET BY MOUTH  DAILY 90 tablet 3  . citalopram (CELEXA) 20 MG tablet TAKE 2 TABLETS BY MOUTH  DAILY 180 tablet 3  . dapsone 100 MG tablet Take 200 mg by mouth daily.    Marland Kitchen estradiol (ESTRACE) 1 MG tablet Take 1 tablet (1 mg total) by mouth daily. 90 tablet 3  . gabapentin (NEURONTIN) 300 MG capsule Take 1,800 mg by mouth 3 (three) times daily.    . hydrocortisone 1 % ointment Apply to lesion up to 2x/day    . hydroxychloroquine (PLAQUENIL) 200 MG tablet Take 400 mg by mouth daily.    . Immune Globulin, Human, 40 GM/400ML SOLN Inject into the vein.    . methadone (DOLOPHINE) 10 MG tablet Take 40 mg  by mouth daily. Prescribed by [facility]    . nabumetone (RELAFEN) 500 MG tablet TAKE 1 TABLET BY MOUTH TWO  TIMES DAILY AS NEEDED    . pilocarpine (SALAGEN) 5 MG tablet Take 5 mg by mouth as needed.     . promethazine (PHENERGAN) 25 MG tablet Take 25 mg by mouth every 6 (six) hours as needed.      No current facility-administered medications for this visit.     ALLERGIES: Penicillins, Sulfa antibiotics, Tramadol, and Trazodone and nefazodone  Family History  Problem Relation Age of Onset  . Heart disease Mother   . Hypertension Mother   . Heart disease Maternal Grandfather   . Heart attack Maternal Grandfather   . Diabetes Brother     Social History   Socioeconomic History  . Marital status: Single    Spouse name: Not on file  . Number of children: Not on file  . Years of education: Not on file  . Highest education level: Not on file  Occupational History  . Not on file  Tobacco Use  . Smoking status: Never Smoker  . Smokeless tobacco: Never Used  Vaping Use  . Vaping Use: Never used  Substance and Sexual  Activity  . Alcohol use: Yes    Alcohol/week: 4.0 - 7.0 standard drinks    Types: 4 - 7 Standard drinks or equivalent per week  . Drug use: No  . Sexual activity: Yes    Partners: Male    Birth control/protection: Surgical    Comment: hysterectomy  Other Topics Concern  . Not on file  Social History Narrative  . Not on file   Social Determinants of Health   Financial Resource Strain: Not on file  Food Insecurity: Not on file  Transportation Needs: Not on file  Physical Activity: Not on file  Stress: Not on file  Social Connections: Not on file  Intimate Partner Violence: Not on file    Review of Systems  Constitutional: Negative.   HENT: Negative.   Eyes: Negative.   Respiratory: Negative.   Cardiovascular: Negative.   Gastrointestinal: Negative.   Genitourinary: Negative.   Musculoskeletal: Negative.   Skin: Negative.   Neurological: Negative.   Endo/Heme/Allergies: Negative.   Psychiatric/Behavioral: Negative.     PHYSICAL EXAMINATION:    BP (!) 144/80 (BP Location: Right Arm, Patient Position: Sitting, Cuff Size: Normal)   Pulse 80   Ht 5\' 9"  (1.753 m)   Wt 217 lb (98.4 kg)   BMI 32.05 kg/m     General appearance: alert, cooperative and appears stated age   Pelvic: External genitalia:  no lesions              Urethra:  normal appearing urethra with no masses, tenderness or lesions              Bartholins and Skenes: normal                 Vagina: normal appearing vagina with normal color and discharge, no lesions              Cervix: absent  Colposcopy: minimal area of decreased lugols uptake at the vaginal apex at 9 o'clock. Biopsy taken. Entire vagina visualized.   Chaperone was present for exam.  1. LGSIL Pap smear of vagina Minimal changes noted on colposcopy, biopsy taken.  - Surgical pathology( [place])

## 2020-12-22 NOTE — Patient Instructions (Signed)

## 2020-12-24 LAB — SURGICAL PATHOLOGY

## 2021-01-05 NOTE — Telephone Encounter (Signed)
Colpo completed on 12/22/20.   Encounter closed.

## 2021-02-26 ENCOUNTER — Encounter: Payer: Self-pay | Admitting: Obstetrics and Gynecology

## 2021-04-02 ENCOUNTER — Other Ambulatory Visit: Payer: Self-pay | Admitting: Physician Assistant

## 2021-05-14 ENCOUNTER — Other Ambulatory Visit: Payer: Self-pay | Admitting: Obstetrics and Gynecology

## 2021-05-14 DIAGNOSIS — Z1231 Encounter for screening mammogram for malignant neoplasm of breast: Secondary | ICD-10-CM

## 2021-07-07 ENCOUNTER — Other Ambulatory Visit: Payer: Self-pay

## 2021-07-07 ENCOUNTER — Ambulatory Visit
Admission: RE | Admit: 2021-07-07 | Discharge: 2021-07-07 | Disposition: A | Discharge status: Home / Self Care | Payer: Medicare Other | Source: Ambulatory Visit

## 2021-07-07 DIAGNOSIS — Z1231 Encounter for screening mammogram for malignant neoplasm of breast: Secondary | ICD-10-CM

## 2021-09-08 NOTE — Progress Notes (Signed)
Kerri Young is a 50 y.o. female here to discuss medication.  History of Present Illness:   Chief Complaint  Patient presents with   Medication Refill    Refill bupropion and citalopram.     HPI  Depression/ Anxiety Currently compliant with celexa 20 mg (2 tablets QD) and wellbutrin XL 300 mg with no adverse effects. Kerri Young reports feeling mentally stable at the moment and is managing well. Denies SI/HI.  Tinnitus Kerri Young c/o intermittent tinnitus that she noticed occurs when her allergies are a problem for her. At the moment she is not concerned and is managing well with OTC medications. Denies hearing loss.    Past Medical History:  Diagnosis Date   Abnormal Pap smear of vagina 02/05/2019   LGSIL   Atypical endocervical cells on Pap smear 05/23/2017   Gestational diabetes    Lupus (HCC)    Other autoimmune hemolytic anemias 05/23/2017   Sjogren's syndrome (HCC) 05/23/2017     Social History   Tobacco Use   Smoking status: Never   Smokeless tobacco: Never  Vaping Use   Vaping Use: Never used  Substance Use Topics   Alcohol use: Yes    Alcohol/week: 4.0 - 7.0 standard drinks    Types: 4 - 7 Standard drinks or equivalent per week   Drug use: No    Past Surgical History:  Procedure Laterality Date   CERVICAL BIOPSY  W/ LOOP ELECTRODE EXCISION     CESAREAN SECTION  1996, 1999   COLPOSCOPY     DILATION AND CURETTAGE OF UTERUS     LAPAROTOMY  2008   x 2    PELVIC LAPAROSCOPY     TOTAL ABDOMINAL HYSTERECTOMY  2008   TUBAL LIGATION      Family History  Problem Relation Age of Onset   Heart disease Mother    Hypertension Mother    Heart disease Maternal Grandfather    Heart attack Maternal Grandfather    Diabetes Brother     Allergies  Allergen Reactions   Penicillins Other (See Comments)    Told not to take due to Lupus   Sulfa Antibiotics Other (See Comments)    Told not to take due to Lupus   Tramadol Other (See Comments)    Confusion and  disorientation   Trazodone And Nefazodone Hives    Current Medications:   Current Outpatient Medications:    ALPRAZolam (XANAX) 0.5 MG tablet, Take 0.5 mg by mouth as needed. , Disp: , Rfl:    azaTHIOprine (IMURAN) 50 MG tablet, Take 150 mg by mouth daily., Disp: , Rfl:    baclofen (LIORESAL) 20 MG tablet, Take 20 mg by mouth daily. , Disp: , Rfl:    citalopram (CELEXA) 40 MG tablet, Take 1 tablet (40 mg total) by mouth daily., Disp: 90 tablet, Rfl: 3   estradiol (ESTRACE) 1 MG tablet, Take 1 tablet (1 mg total) by mouth daily., Disp: 90 tablet, Rfl: 3   gabapentin (NEURONTIN) 300 MG capsule, Take 1,800 mg by mouth 3 (three) times daily., Disp: , Rfl:    hydrocortisone 1 % ointment, Apply to lesion up to 2x/day, Disp: , Rfl:    hydroxychloroquine (PLAQUENIL) 200 MG tablet, Take 400 mg by mouth daily., Disp: , Rfl:    methadone (DOLOPHINE) 10 MG tablet, Take 40 mg by mouth daily. Prescribed by [facility], Disp: , Rfl:    nabumetone (RELAFEN) 500 MG tablet, TAKE 1 TABLET BY MOUTH TWO  TIMES DAILY AS NEEDED,  Disp: , Rfl:    pilocarpine (SALAGEN) 5 MG tablet, Take 5 mg by mouth as needed. , Disp: , Rfl:    promethazine (PHENERGAN) 25 MG tablet, Take 25 mg by mouth every 6 (six) hours as needed. , Disp: , Rfl:    buPROPion (WELLBUTRIN XL) 300 MG 24 hr tablet, Take 1 tablet (300 mg total) by mouth daily., Disp: 90 tablet, Rfl: 3   Review of Systems:   ROS Negative unless otherwise specified per HPI.  Vitals:   Vitals:   09/09/21 1048  BP: 124/62  Pulse: 78  Temp: 98 F (36.7 C)  TempSrc: Temporal  SpO2: 94%  Weight: 218 lb 6.4 oz (99.1 kg)     Body mass index is 32.25 kg/m.  Physical Exam:   Physical Exam Vitals and nursing note reviewed.  Constitutional:      General: She is not in acute distress.    Appearance: She is well-developed. She is not ill-appearing or toxic-appearing.  Cardiovascular:     Rate and Rhythm: Normal rate and regular rhythm.     Pulses:  Normal pulses.     Heart sounds: Normal heart sounds, S1 normal and S2 normal.  Pulmonary:     Effort: Pulmonary effort is normal.     Breath sounds: Normal breath sounds.  Skin:    General: Skin is warm and dry.  Neurological:     Mental Status: She is alert.     GCS: GCS eye subscore is 4. GCS verbal subscore is 5. GCS motor subscore is 6.  Psychiatric:        Speech: Speech normal.        Behavior: Behavior normal. Behavior is cooperative.    Assessment and Plan:   Anxiety; Depression, major, single episode, mild (HCC) Well controlled Continue celexa 40 mg daily and wellbutrin xl 300 mg daily Follow-up in 1 year, sooner if concerns   Tinnitus, left Patient declines work-up Encouraged use of flonase and hearing protection Follow-up if any concerns   I,Havlyn C Ratchford,acting as a scribe for [employer], PA.,have documented all relevant documentation on the behalf of Jarold Motto, PA,as directed by  Jarold Motto, PA while in the presence of Jarold Motto, [place].  I, Jarold Motto, [place], have reviewed all documentation for this visit. The documentation on 09/09/21 for the exam, diagnosis, procedures, and orders are all accurate and complete.  Jarold Motto, PA-C

## 2021-09-09 ENCOUNTER — Ambulatory Visit (INDEPENDENT_AMBULATORY_CARE_PROVIDER_SITE_OTHER): Payer: Medicare Other | Admitting: Physician Assistant

## 2021-09-09 ENCOUNTER — Encounter: Payer: Self-pay | Admitting: Physician Assistant

## 2021-09-09 ENCOUNTER — Other Ambulatory Visit: Payer: Self-pay

## 2021-09-09 VITALS — BP 124/62 | HR 78 | Temp 98.0°F | Wt 218.4 lb

## 2021-09-09 DIAGNOSIS — F320 Major depressive disorder, single episode, mild: Secondary | ICD-10-CM | POA: Diagnosis not present

## 2021-09-09 DIAGNOSIS — F419 Anxiety disorder, unspecified: Secondary | ICD-10-CM

## 2021-09-09 DIAGNOSIS — H9312 Tinnitus, left ear: Secondary | ICD-10-CM

## 2021-09-09 MED ORDER — CITALOPRAM HYDROBROMIDE 40 MG PO TABS: 40.0000 mg | ORAL_TABLET | Freq: Every day | ORAL | 3 refills | Status: DC

## 2021-09-09 MED ORDER — BUPROPION HCL ER (XL) 300 MG PO TB24: 300.0000 mg | ORAL_TABLET | Freq: Every day | ORAL | 3 refills | Status: DC

## 2021-10-06 IMAGING — MG MM DIGITAL SCREENING BILAT W/ TOMO AND CAD
6 of 10 series · 6 of 30 positions shown · non-contrast
Comparison: Previous exam(s).

CLINICAL DATA: Screening.

EXAM:
DIGITAL SCREENING BILATERAL MAMMOGRAM WITH TOMOSYNTHESIS AND CAD
TECHNIQUE: Bilateral screening digital craniocaudal and mediolateral oblique
mammograms were obtained. Bilateral screening digital breast
tomosynthesis was performed. The images were evaluated with
computer-aided detection.

[R MLO synth-2D (1 of 2)]
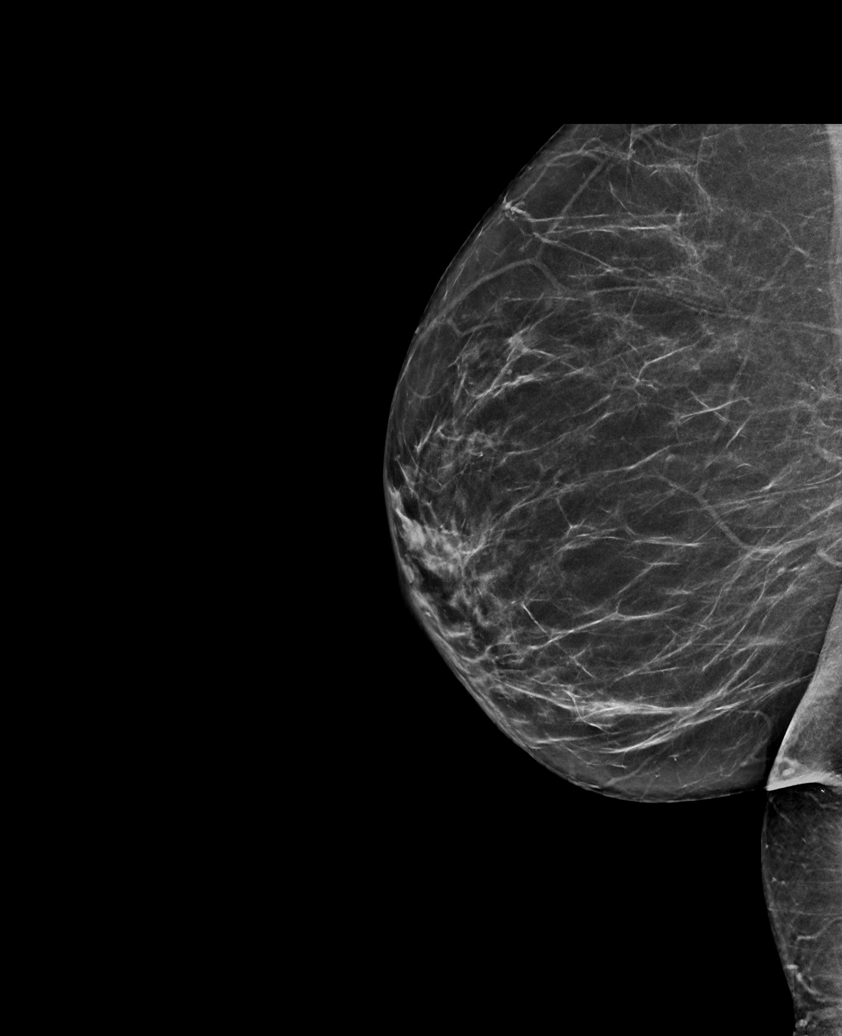

[R MLO synth-2D (2 of 2)]
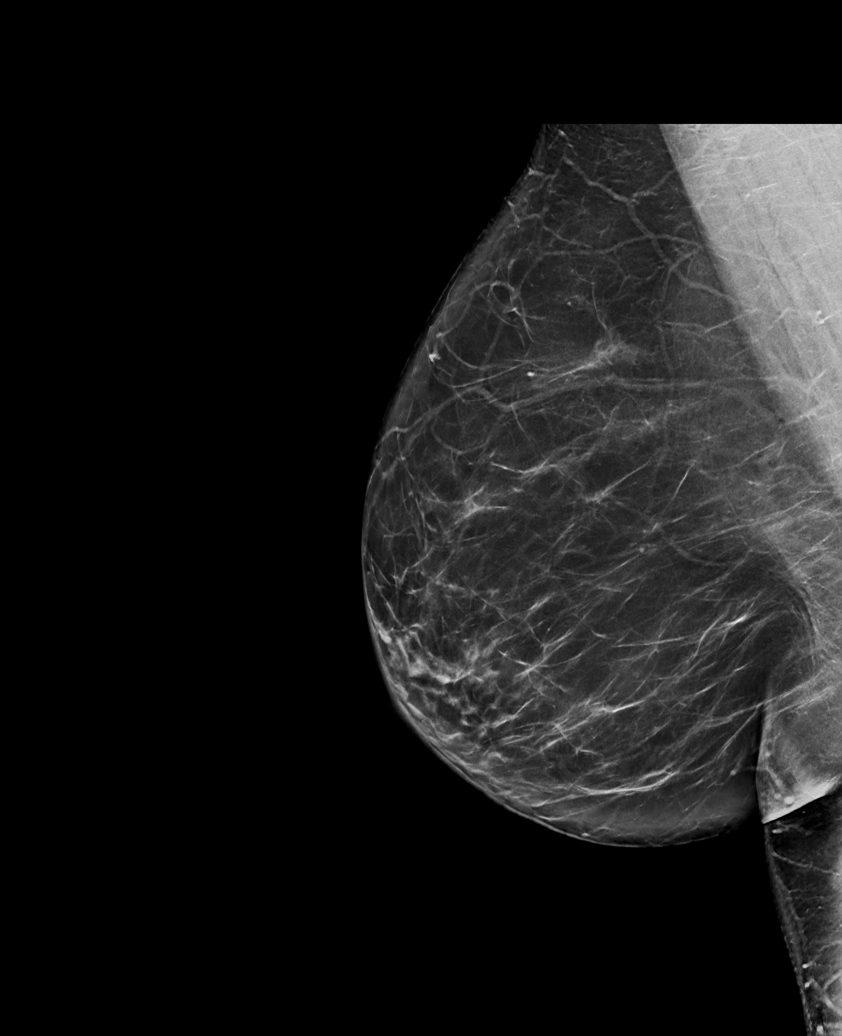

[L MLO synth-2D]
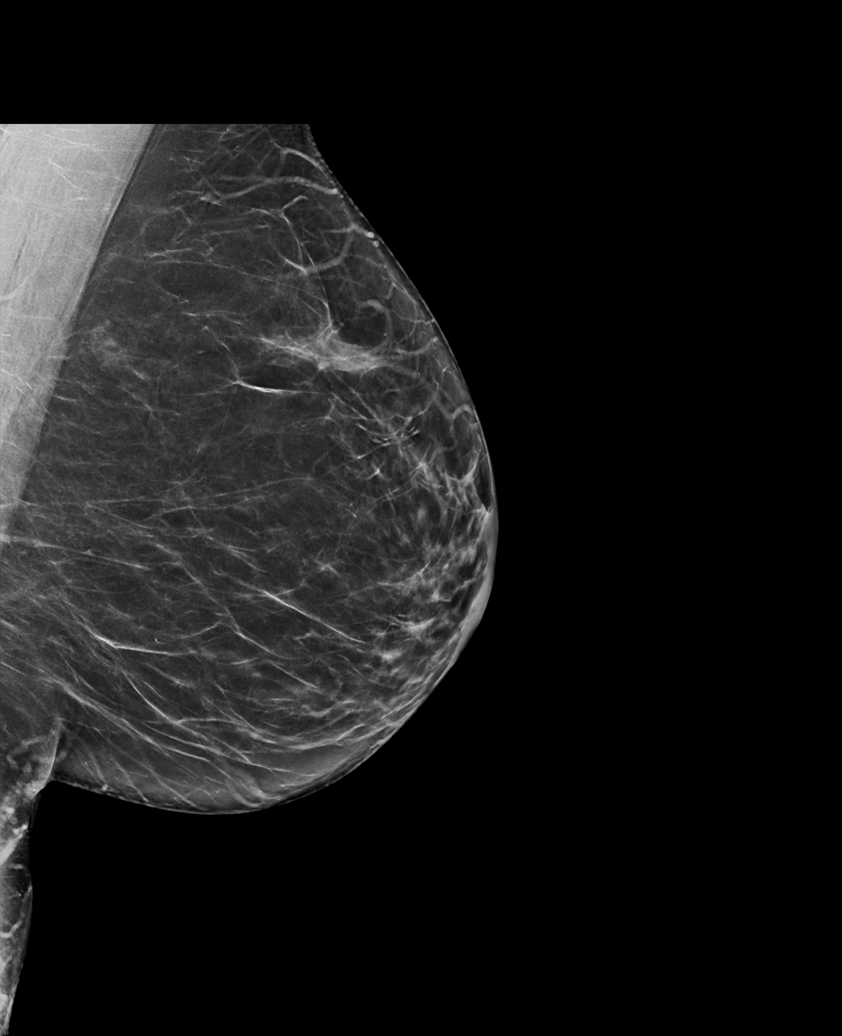

[L CC synth-2D]
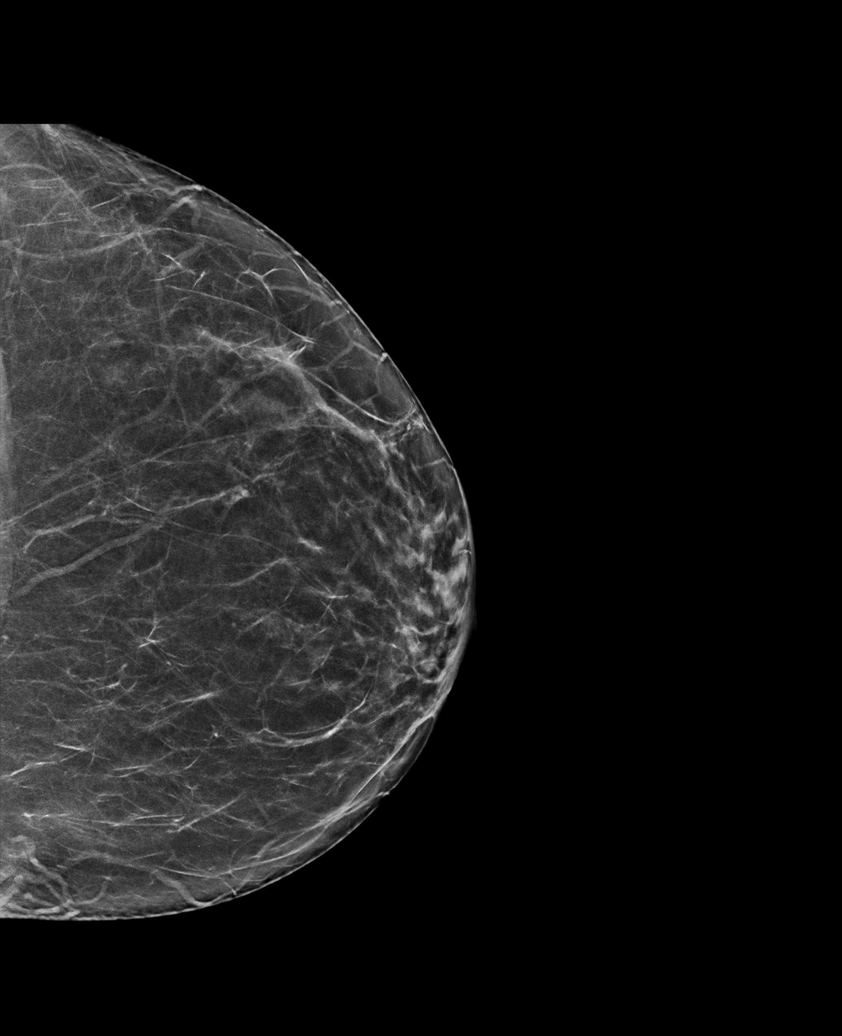

[R CC synth-2D]
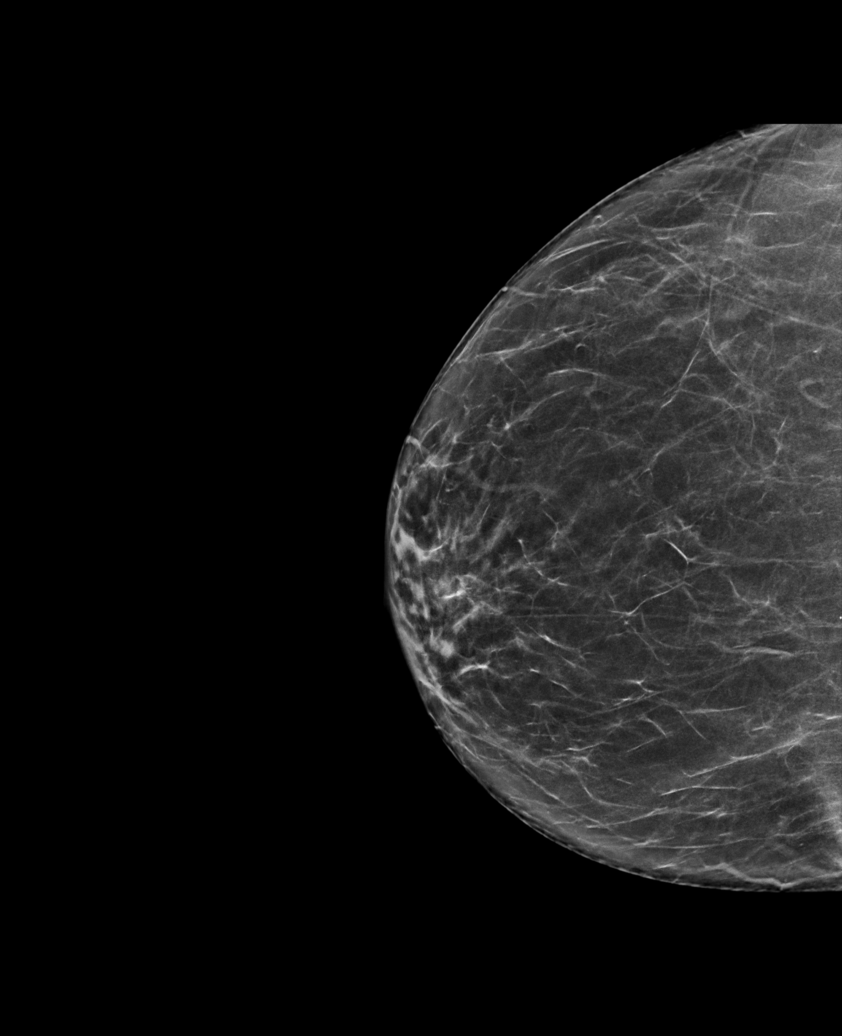

[R MLO tomo · tomo slice 43/86.0]
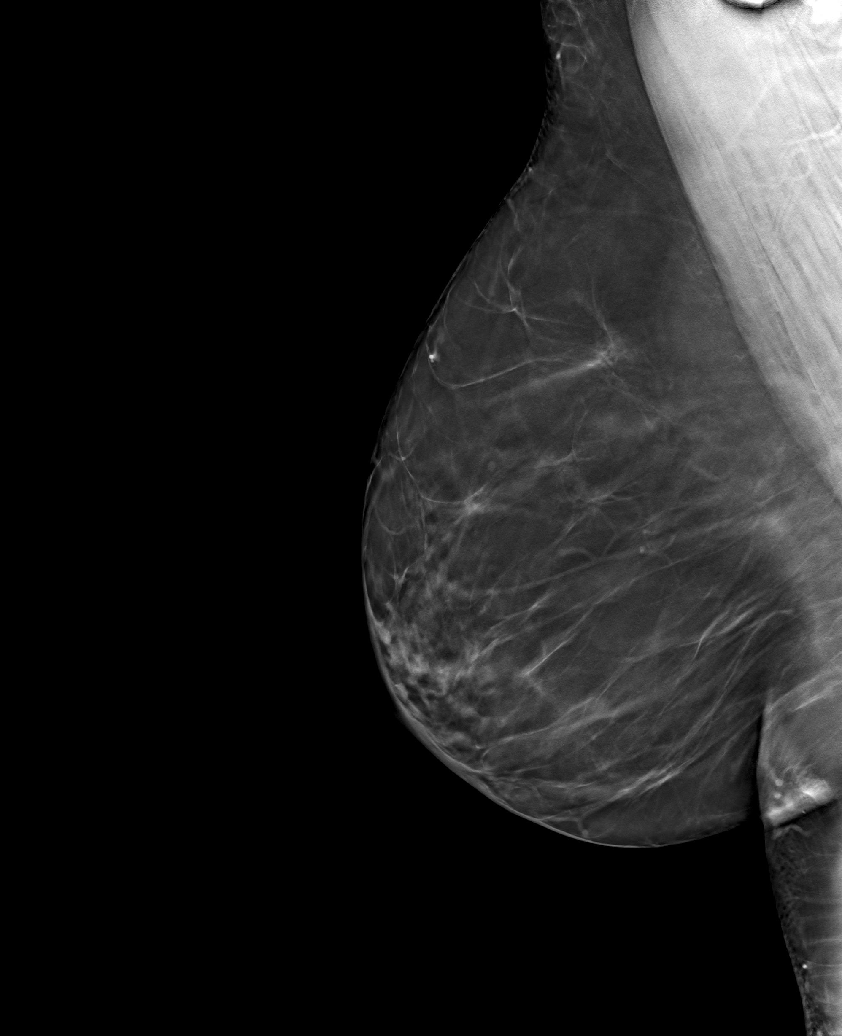

[6 of 30 positions shown; findings below may reference images not displayed]

ACR Breast Density Category b: There are scattered areas of
fibroglandular density.
FINDINGS: There are no findings suspicious for malignancy.
IMPRESSION: No mammographic evidence of malignancy. A result letter of this
screening mammogram will be mailed directly to the patient.

RECOMMENDATION:
Screening mammogram in one year. (Code:51-O-LD2)

BI-RADS CATEGORY  1: Negative.

## 2021-11-19 ENCOUNTER — Other Ambulatory Visit: Payer: Self-pay | Admitting: Obstetrics and Gynecology

## 2021-11-19 DIAGNOSIS — Z5181 Encounter for therapeutic drug level monitoring: Secondary | ICD-10-CM

## 2021-11-23 NOTE — Telephone Encounter (Signed)
AEX scheduled for 11/24/21. Mammo UTD.

## 2021-11-23 NOTE — Progress Notes (Signed)
51 y.o. [id number] Single Declined Not Hispanic or Latino female here for annual exam.  Sexually active, no dyspareunia. Same partner x 4 years.   H/O hysterectomy, H/O VAIN I.    H/O abnormal vaginal pap smears and colposcopies when living in [place]. Never had any treatment.  She is immunocompromised.   On oral ERT, didn't tolerate the patch  H/O lupus, no h/o blood clots, no antiphospholipid antibodies.   No LMP recorded. Patient has had a hysterectomy.          Sexually active: Yes.    The current method of family planning is status post hysterectomy.    Exercising: Yes.     Walking and yoga  Smoker:  no  Health Maintenance: Pap: 10/29/20 LSIL Hr HPV Neg, 10/10/19 LISIL Hr HPV Neg, 01-23-18 neg HPV HR neg   History of abnormal Pap:  yes colpo 12/22/20 VAIN I MMG:  07/08/21 density B Bi-rads 1 neg  BMD:   over 10 years  Colonoscopy: never  TDaP:  up to date per patient  Gardasil: na    reports that she has never smoked. She has never used smokeless tobacco. She reports current alcohol use of about 4.0 - 7.0 standard drinks per week. She reports that she does not use drugs. Disabled with the lupus. Daughters are grown, one is going to [employer] and one lives in [place].   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      azaTHIOprine (IMURAN) 50 MG  tablet Take 150 mg by mouth daily.     baclofen (LIORESAL) 20 MG tablet Take 20 mg by mouth daily.      buPROPion (WELLBUTRIN XL) 300 MG 24 hr tablet Take 1 tablet (300 mg total) by mouth daily. 90 tablet 3   citalopram (CELEXA) 40 MG tablet Take 1 tablet (40 mg total) by mouth daily. 90 tablet 3   estradiol (ESTRACE) 1 MG tablet TAKE 1 TABLET BY MOUTH  DAILY 90 tablet 0   gabapentin (NEURONTIN) 300 MG capsule Take 1,800 mg by mouth 3 (three) times daily.     hydrocortisone 1 % ointment Apply to lesion up to 2x/day     hydroxychloroquine (PLAQUENIL) 200 MG tablet Take 400 mg by mouth daily.     methadone (DOLOPHINE) 10 MG tablet Take 40 mg by mouth daily. Prescribed by [facility]     nabumetone (RELAFEN) 500 MG tablet TAKE 1 TABLET BY MOUTH TWO  TIMES DAILY AS NEEDED     pilocarpine (SALAGEN) 5 MG tablet Take 5 mg by mouth as needed.      promethazine (PHENERGAN) 25 MG tablet Take 25 mg by mouth every 6 (six) hours as needed.  No current facility-administered medications for this visit.    Family History  Problem Relation Age of Onset   Heart disease Mother    Hypertension Mother    Heart disease Maternal Grandfather    Heart attack Maternal Grandfather    Diabetes Brother     Review of Systems  All other systems reviewed and are negative.  Exam:   BP 122/70    Pulse 77    Ht 5\' 9"  (1.753 m)    Wt 220 lb (99.8 kg)    SpO2 98%    BMI 32.49 kg/m   Weight change: @WEIGHTCHANGE @ Height:   Height: 5\' 9"  (175.3 cm)  Ht Readings from Last 3 Encounters:  11/24/21 5\' 9"  (1.753 m)  12/22/20 5\' 9"  (1.753 m)  10/29/20 5\' 8"  (1.727 m)    General appearance: alert, cooperative and appears stated age Head: Normocephalic, without obvious abnormality, atraumatic Neck: no adenopathy, supple, symmetrical, trachea midline and thyroid normal to inspection and palpation Lungs: clear to auscultation bilaterally Cardiovascular: regular rate and rhythm Breasts: normal appearance, no  masses or tenderness Abdomen: soft, non-tender; non distended,  no masses,  no organomegaly Extremities: extremities normal, atraumatic, no cyanosis or edema Skin: Skin color, texture, turgor normal. No rashes or lesions Lymph nodes: Cervical, supraclavicular, and axillary nodes normal. No abnormal inguinal nodes palpated Neurologic: Grossly normal   Pelvic: External genitalia:  no lesions              Urethra:  normal appearing urethra with no masses, tenderness or lesions              Bartholins and Skenes: normal                 Vagina: normal appearing vagina with normal color and discharge, no lesions              Cervix: absent               Bimanual Exam:  Uterus:  uterus absent              Adnexa: no mass, fullness, tenderness               Rectovaginal: Confirms               Anus:  normal sphincter tone, no lesions  Gae Dry chaperoned for the exam.  1. Well woman exam Discussed breast self exam Discussed calcium and vit D intake Labs UTD  2. Screening for vaginal cancer - Cytology - PAP  3. History of vaginal dysplasia - Cytology - PAP  4. Colon cancer screening Discussed options - Ambulatory referral to Gastroenterology  5. Encounter for monitoring postmenopausal estrogen replacement therapy Didn't tolerate the patch, discussed the option of the estrogen gel (would decrease her risk of clots), she wants to continue on oral ERT (aware of risks). - estradiol (ESTRACE) 1 MG tablet; Take 1 tablet (1 mg total) by mouth daily.  Dispense: 90 tablet; Refill: 3

## 2021-11-24 ENCOUNTER — Other Ambulatory Visit: Payer: Self-pay

## 2021-11-24 ENCOUNTER — Ambulatory Visit (INDEPENDENT_AMBULATORY_CARE_PROVIDER_SITE_OTHER): Payer: Medicare Other | Admitting: Obstetrics and Gynecology

## 2021-11-24 ENCOUNTER — Other Ambulatory Visit (HOSPITAL_COMMUNITY)
Admission: RE | Admit: 2021-11-24 | Discharge: 2021-11-24 | Disposition: A | Discharge status: Home / Self Care | Payer: Medicare Other | Source: Ambulatory Visit | Attending: Obstetrics and Gynecology | Admitting: Obstetrics and Gynecology

## 2021-11-24 ENCOUNTER — Encounter: Payer: Self-pay | Admitting: Obstetrics and Gynecology

## 2021-11-24 VITALS — BP 122/70 | HR 77 | Ht 69.0 in | Wt 220.0 lb

## 2021-11-24 DIAGNOSIS — Z87411 Personal history of vaginal dysplasia: Secondary | ICD-10-CM | POA: Diagnosis not present

## 2021-11-24 DIAGNOSIS — Z1151 Encounter for screening for human papillomavirus (HPV): Secondary | ICD-10-CM | POA: Insufficient documentation

## 2021-11-24 DIAGNOSIS — Z1211 Encounter for screening for malignant neoplasm of colon: Secondary | ICD-10-CM | POA: Diagnosis not present

## 2021-11-24 DIAGNOSIS — Z5181 Encounter for therapeutic drug level monitoring: Secondary | ICD-10-CM

## 2021-11-24 DIAGNOSIS — Z79890 Hormone replacement therapy (postmenopausal): Secondary | ICD-10-CM

## 2021-11-24 DIAGNOSIS — Z01419 Encounter for gynecological examination (general) (routine) without abnormal findings: Secondary | ICD-10-CM | POA: Diagnosis not present

## 2021-11-24 DIAGNOSIS — Z1272 Encounter for screening for malignant neoplasm of vagina: Secondary | ICD-10-CM | POA: Diagnosis not present

## 2021-11-24 MED ORDER — ESTRADIOL 1 MG PO TABS: 1.0000 mg | ORAL_TABLET | Freq: Every day | ORAL | 3 refills | Status: DC

## 2021-11-24 NOTE — Patient Instructions (Signed)

## 2021-11-27 LAB — CYTOLOGY - PAP
Comment: NEGATIVE
Diagnosis: UNDETERMINED — AB
High risk HPV: NEGATIVE

## 2022-03-10 ENCOUNTER — Other Ambulatory Visit: Payer: Self-pay

## 2022-03-10 DIAGNOSIS — Z5181 Encounter for therapeutic drug level monitoring: Secondary | ICD-10-CM

## 2022-03-10 NOTE — Telephone Encounter (Signed)
[facility] sending inquiry re: estradiol Rx.  ?It states, "Your pt's state of permanent residence requires your approval to increase dispensed quantity. We have processed the Rx as written to prevent therapy delays. If you would like to increase the quantity to maximize their insurance benefits, pls send [place] a new Rx for a 100day supply for future fills. Thank you." ? ?Last AEX 11/24/21--scheduled for 11/30/22 ?Last mammo-07/07/21.  ?

## 2022-03-14 MED ORDER — ESTRADIOL 1 MG PO TABS: 1.0000 mg | ORAL_TABLET | Freq: Every day | ORAL | 2 refills | Status: DC

## 2022-05-20 ENCOUNTER — Other Ambulatory Visit: Payer: Self-pay | Admitting: Obstetrics and Gynecology

## 2022-05-20 DIAGNOSIS — Z1231 Encounter for screening mammogram for malignant neoplasm of breast: Secondary | ICD-10-CM

## 2022-06-22 ENCOUNTER — Encounter: Payer: Self-pay | Admitting: Gastroenterology

## 2022-06-30 ENCOUNTER — Telehealth: Payer: Self-pay

## 2022-06-30 ENCOUNTER — Ambulatory Visit (AMBULATORY_SURGERY_CENTER): Payer: Self-pay

## 2022-06-30 VITALS — Ht 69.0 in | Wt 221.0 lb

## 2022-06-30 DIAGNOSIS — Z1211 Encounter for screening for malignant neoplasm of colon: Secondary | ICD-10-CM

## 2022-06-30 MED ORDER — ONDANSETRON HCL 4 MG PO TABS: 4.0000 mg | ORAL_TABLET | ORAL | 0 refills | Status: DC

## 2022-06-30 MED ORDER — PEG 3350-KCL-NA BICARB-NACL 420 G PO SOLR: 4000.0000 mL | Freq: Once | ORAL | 0 refills | Status: AC

## 2022-06-30 NOTE — Telephone Encounter (Signed)
Pt takes nitrofurantoin and is asking does she need to stop taking for 7 days prior to colonoscopy or can it be less days than that?  States tyelenl does not work for her pain and she would be in a lot of pain if she had to stop it for 7 days.

## 2022-06-30 NOTE — Progress Notes (Signed)
No egg or soy allergy known to patient  No issues known to pt with past sedation with any surgeries or procedures  Patient denies ever being told they had issues or difficulty with intubation   No FH of Malignant Hyperthermia Pt is not on diet pills  Pt is not on  home 02   Pt is not on blood thinners   Pt denies issues with constipation   No A fib or A flutter    Have any cardiac testing pending--no  Pt instructed to use [employer] or GoodRx for a price reduction on prep   Pt takes Relafen for pain, questions if she can take it at least up to 3 days before the procedure since she would be having a lot of pain if she had to stop for 7 days and tylenol does not help her pain.TE sent to Dr Orvan Falconer via Earleen Reaper, pt instructed to hold for 7 days unless Dr Orvan Falconer changes the hold.

## 2022-07-04 NOTE — Telephone Encounter (Signed)
Lm for pt that Dr Orvan Falconer said it was ok for her to continue the macrodantin prior to her procedure, instructed pt to call if any questions.

## 2022-07-05 ENCOUNTER — Encounter: Payer: Self-pay | Admitting: Gastroenterology

## 2022-07-12 ENCOUNTER — Encounter: Payer: Self-pay | Admitting: Physician Assistant

## 2022-07-12 DIAGNOSIS — Z1231 Encounter for screening mammogram for malignant neoplasm of breast: Secondary | ICD-10-CM

## 2022-07-12 MED ORDER — BUPROPION HCL ER (XL) 300 MG PO TB24: 300.0000 mg | ORAL_TABLET | Freq: Every day | ORAL | 0 refills | Status: DC

## 2022-07-12 MED ORDER — CITALOPRAM HYDROBROMIDE 40 MG PO TABS: 40.0000 mg | ORAL_TABLET | Freq: Every day | ORAL | 0 refills | Status: DC

## 2022-07-13 ENCOUNTER — Other Ambulatory Visit: Payer: Self-pay | Admitting: Physician Assistant

## 2022-07-14 NOTE — Telephone Encounter (Signed)
Patient has been scheduled for an OV on 08/25/22 @ 9:40am due to declining a physical to be completed.

## 2022-07-20 ENCOUNTER — Ambulatory Visit
Admission: RE | Admit: 2022-07-20 | Discharge: 2022-07-20 | Disposition: A | Discharge status: Home / Self Care | Payer: Medicare Other | Source: Ambulatory Visit | Attending: Obstetrics and Gynecology | Admitting: Obstetrics and Gynecology

## 2022-07-20 DIAGNOSIS — Z1231 Encounter for screening mammogram for malignant neoplasm of breast: Secondary | ICD-10-CM

## 2022-07-21 ENCOUNTER — Ambulatory Visit (AMBULATORY_SURGERY_CENTER): Payer: Medicare Other | Admitting: Gastroenterology

## 2022-07-21 ENCOUNTER — Encounter: Payer: Self-pay | Admitting: Gastroenterology

## 2022-07-21 VITALS — BP 160/89 | HR 76 | Temp 98.2°F | Resp 16 | Ht 69.0 in | Wt 221.0 lb

## 2022-07-21 DIAGNOSIS — Z1211 Encounter for screening for malignant neoplasm of colon: Secondary | ICD-10-CM

## 2022-07-21 MED ORDER — SODIUM CHLORIDE 0.9 % IV SOLN
500.0000 mL | Freq: Once | INTRAVENOUS | Status: DC
Start: 2022-07-21 — End: 2022-07-21

## 2022-07-21 NOTE — Patient Instructions (Addendum)
Thank you for letting us take care of your heatlhcare needs today.     YOU HAD AN ENDOSCOPIC PROCEDURE TODAY AT THE [facility]:   Refer to the procedure report that was given to you for any specific questions about what was found during the examination.  If the procedure report does not answer your questions, please call your gastroenterologist to clarify.  If you requested that your care partner not be given the details of your procedure findings, then the procedure report has been included in a sealed envelope for you to review at your convenience later.  YOU SHOULD EXPECT: Some feelings of bloating in the abdomen. Passage of more gas than usual.  Walking can help get rid of the air that was put into your GI tract during the procedure and reduce the bloating. If you had a lower endoscopy (such as a colonoscopy or flexible sigmoidoscopy) you may notice spotting of blood in your stool or on the toilet paper. If you underwent a bowel prep for your procedure, you may not have a normal bowel movement for a few days.  Please Note:  You might notice some irritation and congestion in your nose or some drainage.  This is from the oxygen used during your procedure.  There is no need for concern and it should clear up in a day or so.  SYMPTOMS TO REPORT IMMEDIATELY:  Following lower endoscopy (colonoscopy or flexible sigmoidoscopy):  Excessive amounts of blood in the stool  Significant tenderness or worsening of abdominal pains  Swelling of the abdomen that is new, acute  Fever of 100F or higher  For urgent or emergent issues, a gastroenterologist can be reached at any hour by calling [phone]. Do not use MyChart messaging for urgent concerns.    DIET:  We do recommend a small meal at first, but then you may proceed to your regular diet.  Drink plenty of fluids but you should avoid alcoholic beverages for 24 hours.  ACTIVITY:  You should plan to take it easy for the rest of today  and you should NOT DRIVE or use heavy machinery until tomorrow (because of the sedation medicines used during the test).    FOLLOW UP: Our staff will call the number listed on your records the next business day following your procedure.  We will call around 7:15- 8:00 am to check on you and address any questions or concerns that you may have regarding the information given to you following your procedure. If we do not reach you, we will leave a message.  If you develop any symptoms (ie: fever, flu-like symptoms, shortness of breath, cough etc.) before then, please call [phone].  If you test positive for Covid 19 in the 2 weeks post procedure, please call and report this information to [place].    If any biopsies were taken you will be contacted by phone or by letter within the next 1-3 weeks.  Please call us at [phone] if you have not heard about the biopsies in 3 weeks.    SIGNATURES/CONFIDENTIALITY: You and/or your care partner have signed paperwork which will be entered into your electronic medical record.  These signatures attest to the fact that that the information above on your After Visit Summary has been reviewed and is understood.  Full responsibility of the confidentiality of this discharge information lies with you and/or your care-partner.

## 2022-07-21 NOTE — Progress Notes (Signed)
Referring Provider: Jarold Motto, PA Primary Care Physician:  Jarold Motto, PA  Indication for Colonoscopy:  Colon cancer screening   IMPRESSION:  Need for colon cancer screening Appropriate candidate for monitored anesthesia care  PLAN: Colonoscopy in the [facility] today   HPI: Kerri Young is a 51 y.o. female presents for screening colonoscopy.  No prior colonoscopy or colon cancer screening.  No known family history of colon cancer or polyps. No family history of uterine/endometrial cancer, pancreatic cancer or gastric/stomach cancer.   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     buPROPion (WELLBUTRIN XL) 300 MG 24 hr tablet TAKE 1 TABLET BY MOUTH  DAILY 100 tablet 1   Calcium Carbonate-Vit D-Min (CALCIUM 1200 PO) Take by mouth.     citalopram (CELEXA) 40 MG tablet Take 1 tablet (40 mg total) by mouth daily. 90 tablet 0   Collagen-Vitamin C-Biotin (COLLAGEN 1500/C PO) Take by mouth.     estradiol (ESTRACE) 1 MG tablet Take 1 tablet (1 mg total) by mouth daily. Order # [id number] 100 tablet 2   gabapentin (NEURONTIN) 300 MG capsule Take 1,800 mg by mouth 3 (three) times daily.     HYDROmorphone (DILAUDID) 2 MG tablet Take 2 mg by mouth 4 (four) times daily.      hydroxychloroquine (PLAQUENIL) 200 MG tablet Take 400 mg by mouth daily.     methadone (DOLOPHINE) 10 MG tablet Take 40 mg by mouth daily. Prescribed by [facility]     nabumetone (RELAFEN) 500 MG tablet TAKE 1 TABLET BY MOUTH TWO  TIMES DAILY AS NEEDED     nitrofurantoin (MACRODANTIN) 50 MG capsule Take 50 mg by mouth daily.     ALPRAZolam (XANAX) 0.5 MG tablet Take 0.5 mg by mouth as needed.      baclofen (LIORESAL) 20 MG tablet Take 20 mg by mouth daily.      hydrocortisone 1 % ointment Apply to lesion up to 2x/day     ondansetron (ZOFRAN) 4 MG tablet Take 1 tablet (4 mg total) by mouth as directed for 2 doses. Take one Zofran 4 mg tablet 30-60 minutes before each prep dose 2 tablet 0   pilocarpine (SALAGEN) 5 MG tablet Take 5 mg by mouth as needed.  (Patient not taking: Reported on 07/21/2022)     promethazine (PHENERGAN) 25 MG tablet Take 25 mg by mouth every 6 (six) hours as needed.      Current Facility-Administered Medications  Medication Dose Route Frequency Provider Last Rate Last Admin   0.9 %  sodium chloride infusion  500 mL Intravenous Once Tressia Danas, MD  Allergies as of 07/21/2022 - Review Complete 07/21/2022  Allergen Reaction Noted   Penicillins Other (See Comments) 05/23/2017   Sulfa antibiotics Other (See Comments) 05/23/2017   Tramadol Other (See Comments) 05/23/2017   Trazodone and nefazodone Hives 05/23/2017    Family History  Problem Relation Age of Onset   Heart disease Mother    Hypertension Mother    Diabetes Brother    Heart disease Maternal Grandfather    Heart attack Maternal Grandfather    Colon cancer Neg Hx    Colon polyps Neg Hx    Esophageal cancer Neg Hx    Rectal cancer Neg Hx    Stomach cancer Neg Hx      Physical Exam: General:   Alert,  well-nourished, pleasant and cooperative in NAD Head:  Normocephalic and atraumatic. Eyes:  Sclera clear, no icterus.   Conjunctiva pink. Mouth:  No deformity or lesions.   Neck:   Supple; no masses or thyromegaly. Lungs:  Clear throughout to auscultation.   No wheezes. Heart:  Regular rate and rhythm; no murmurs. Abdomen:  Soft, non-tender, nondistended, normal bowel sounds, no rebound or guarding.  Msk:  Symmetrical. No boney deformities LAD: No inguinal or umbilical LAD Extremities:  No clubbing or edema. Neurologic:  Alert and  oriented x4;  grossly nonfocal Skin:  No obvious rash or bruise. Psych:  Alert and cooperative. Normal mood and affect.     Studies/Results: MM 3D SCREEN BREAST BILATERAL  Result Date: 07/21/2022 CLINICAL DATA:  Screening. EXAM: DIGITAL SCREENING BILATERAL MAMMOGRAM WITH TOMOSYNTHESIS AND CAD TECHNIQUE: Bilateral screening digital craniocaudal and mediolateral oblique mammograms were obtained. Bilateral screening digital breast tomosynthesis was performed. The images were evaluated with computer-aided detection. COMPARISON:  Previous exam(s). ACR Breast Density Category b: There are scattered areas of fibroglandular density. FINDINGS: There are no findings suspicious for malignancy. IMPRESSION: No mammographic evidence of malignancy. A result letter of this screening mammogram will be mailed directly to the patient. RECOMMENDATION: Screening mammogram in one year. (Code:SM-B-01Y) BI-RADS CATEGORY  1: Negative. Electronically Signed   By: Bary Richard M.D.   On: 07/21/2022 11:10      Amberlee Garvey L. Orvan Falconer, MD, MPH 07/21/2022, 2:58 PM

## 2022-07-21 NOTE — Progress Notes (Signed)
Pt's states no medical or surgical changes since previsit or office visit. 

## 2022-07-21 NOTE — Progress Notes (Signed)
To pacu, VSS. Report to [email or web address] 

## 2022-07-21 NOTE — Op Note (Signed)
[facility] Patient Name: Kerri Young Procedure Date: 07/21/2022 2:57 PM MRN: [id number] Endoscopist: Thornton Park MD, MD Age: 51 Referring MD:  Date of Birth: 05/29/1971 Gender: Female Account #: [id number] Procedure:                Colonoscopy Indications:              Screening for colorectal malignant neoplasm, This                            is the patient's first colonoscopy                           No known family history of colon cancer or polyps Medicines:                Monitored Anesthesia Care Procedure:                Pre-Anesthesia Assessment:                           - Prior to the procedure, a History and Physical                            was performed, and patient medications and                            allergies were reviewed. The patient's tolerance of                            previous anesthesia was also reviewed. The risks                            and benefits of the procedure and the sedation                            options and risks were discussed with the patient.                            All questions were answered, and informed consent                            was obtained. Prior Anticoagulants: The patient has                            taken no previous anticoagulant or antiplatelet                            agents. ASA Grade Assessment: II - A patient with                            mild systemic disease. After reviewing the risks                            and benefits, the patient was deemed in  satisfactory condition to undergo the procedure.                           After obtaining informed consent, the colonoscope                            was passed under direct vision. Throughout the                            procedure, the patient's blood pressure, pulse, and                            oxygen saturations were monitored continuously. The                            Olympus CF-HQ190L  (Serial# [id number]) Colonoscope was                            introduced through the anus and advanced to the the                            cecum, identified by appendiceal orifice and                            ileocecal valve. A second forward view of the right                            colon was performed. The colonoscopy was unusually                            difficult due to a redundant colon, significant                            looping and a tortuous colon. Successful completion                            of the procedure was aided by applying abdominal                            pressure. The patient tolerated the procedure well.                            The quality of the bowel preparation was good. The                            terminal ileum, ileocecal valve, appendiceal                            orifice, and rectum were photographed. Scope In: 3:07:36 PM Scope Out: 3:23:01 PM Scope Withdrawal Time: 0 hours 9 minutes 39 seconds  Total Procedure Duration: 0 hours 15 minutes 25 seconds  Findings:                 The perianal and digital rectal examinations  were                            normal.                           The entire examined colon appeared normal on direct                            and retroflexion views. However, the colon is                            tortuous and redundant. Complications:            No immediate complications. Estimated Blood Loss:     Estimated blood loss: none. Impression:               - The entire examined colon is normal on direct and                            retroflexion views.                           - No specimens collected. Recommendation:           - Patient has a contact number available for                            emergencies. The signs and symptoms of potential                            delayed complications were discussed with the                            patient. Return to normal activities tomorrow.                             Written discharge instructions were provided to the                            patient.                           - Resume previous diet.                           - Continue present medications.                           - Repeat colonoscopy in 10 years for surveillance,                            earlier with new symptoms.                           - Emerging evidence supports eating a diet of  fruits, vegetables, grains, calcium, and yogurt                            while reducing red meat and alcohol may reduce the                            risk of colon cancer.                           - Thank you for allowing me to be involved in your                            colon cancer prevention. Tressia Danas MD, MD 07/21/2022 3:26:47 PM This report has been signed electronically.

## 2022-07-22 ENCOUNTER — Telehealth: Payer: Self-pay

## 2022-07-22 NOTE — Telephone Encounter (Signed)
  Follow up Call-     07/21/2022    1:45 PM  Call back number  Post procedure Call Back phone  # [phone]  Permission to leave phone message Yes     Post op call attempted, no answer, left WM.

## 2022-08-25 ENCOUNTER — Ambulatory Visit (INDEPENDENT_AMBULATORY_CARE_PROVIDER_SITE_OTHER): Payer: Medicare Other | Admitting: Physician Assistant

## 2022-08-25 ENCOUNTER — Encounter: Payer: Self-pay | Admitting: Physician Assistant

## 2022-08-25 VITALS — BP 130/76 | HR 76 | Temp 97.7°F | Ht 69.0 in | Wt 220.5 lb

## 2022-08-25 DIAGNOSIS — Z0000 Encounter for general adult medical examination without abnormal findings: Secondary | ICD-10-CM | POA: Diagnosis not present

## 2022-08-25 DIAGNOSIS — Z13220 Encounter for screening for lipoid disorders: Secondary | ICD-10-CM

## 2022-08-25 DIAGNOSIS — H6123 Impacted cerumen, bilateral: Secondary | ICD-10-CM

## 2022-08-25 DIAGNOSIS — F419 Anxiety disorder, unspecified: Secondary | ICD-10-CM

## 2022-08-25 DIAGNOSIS — F320 Major depressive disorder, single episode, mild: Secondary | ICD-10-CM | POA: Diagnosis not present

## 2022-08-25 DIAGNOSIS — Z136 Encounter for screening for cardiovascular disorders: Secondary | ICD-10-CM

## 2022-08-25 LAB — LIPID PANEL
Cholesterol: 193 mg/dL (ref 0–200)
HDL: 89.1 mg/dL (ref >39.00)
LDL Cholesterol: 85 mg/dL (ref 0–99)
NonHDL: 103.61
Total CHOL/HDL Ratio: 2
Triglycerides: 93 mg/dL (ref 0.0–149.0)
VLDL: 18.6 mg/dL (ref 0.0–40.0)

## 2022-08-25 NOTE — Patient Instructions (Signed)
It was great to see you!  We will update your cholesterol panel. I will refill your medications x 1 year.  Take care,  Aldona Bar

## 2022-08-25 NOTE — Progress Notes (Signed)
Subjective:    Kerri Young is a 51 y.o. female and is here for a comprehensive physical exam.  Medication Refill Pertinent negatives include no abdominal pain, chest pain, chills, coughing, fever, headaches, myalgias, nausea, neck pain, rash, sore throat or vomiting.    Health Maintenance Due  Topic Date Due   HIV Screening  Never done   Hepatitis C Screening  Never done    Acute Concerns: Cerumen impaction -- b/l. Hx of requiring ENT assistance in the past with this. Denies hearing loss but does get fullness/pressure sensation in her ears.  Chronic Issues: Anxiety and Depression -- currently taking celexa 40 mg daily and wellbutrin 300 mg xl daily. Denies SI/HI.  Health Maintenance: Immunizations -- UTD Colonoscopy -- completed this year, UTD Mammogram -- UTD PAP -- UTD Diet -- well balanced Exercise -- well balanced  Sleep habits -- no major concerns Mood -- stable  UTD with dentist? - no UTD with eye doctor? - no  Weight history: Wt Readings from Last 10 Encounters:  08/25/22 220 lb 8 oz (100 kg)  07/21/22 221 lb (100.2 kg)  06/30/22 221 lb (100.2 kg)  11/24/21 220 lb (99.8 kg)  09/09/21 218 lb 6.4 oz (99.1 kg)  12/22/20 217 lb (98.4 kg)  10/29/20 214 lb 12.8 oz (97.4 kg)  11/05/19 220 lb (99.8 kg)  10/10/19 222 lb 6.4 oz (100.9 kg)  03/26/19 216 lb (98 kg)   Body mass index is 32.56 kg/m. No LMP recorded. Patient has had a hysterectomy.  Alcohol use:  reports current alcohol use of about 4.0 - 7.0 standard drinks of alcohol per week.  Tobacco use:  Tobacco Use: Low Risk  (08/25/2022)   Patient History    Smoking Tobacco Use: Never    Smokeless Tobacco Use: Never    Passive Exposure: Not on file   Eligible for lung cancer screening? no     08/25/2022    9:44 AM  Depression screen PHQ 2/9  Decreased Interest 1  Down, Depressed, Hopeless 0  PHQ - 2 Score 1  Altered sleeping 1  Tired, decreased energy 1  Change in appetite 1  Feeling bad or  failure about yourself  0  Trouble concentrating 0  Moving slowly or fidgety/restless 0  Suicidal thoughts 0  PHQ-9 Score 4  Difficult doing work/chores Not difficult at all     Other providers/specialists: Patient Care Team: Inda Coke, [place] as PCP - General (Physician Assistant)    PMHx, SurgHx, SocialHx, Medications, and Allergies were reviewed in the Visit Navigator and updated as appropriate.   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   Sjogren's syndrome ([place]) 05/23/2017     Past Surgical History:  Procedure Laterality Date   CERVICAL BIOPSY  W/ LOOP ELECTRODE EXCISION     CESAREAN SECTION  1996, 1999   COLPOSCOPY     DILATION AND CURETTAGE OF UTERUS     LAPAROTOMY  2008   x 2    PELVIC LAPAROSCOPY     TOTAL ABDOMINAL HYSTERECTOMY  2008   TUBAL LIGATION       Family History  Problem Relation Age of Onset   Heart disease Mother  Hypertension Mother    Diabetes Brother    Heart disease Maternal Grandfather    Heart attack Maternal Grandfather    Colon cancer Neg Hx    Colon polyps Neg Hx    Esophageal cancer Neg Hx    Rectal cancer Neg Hx    Stomach cancer Neg Hx     Social History   Tobacco Use   Smoking status: Never   Smokeless tobacco: Never  Vaping Use   Vaping Use: Never used  Substance Use Topics   Alcohol use: Yes    Alcohol/week: 4.0 - 7.0 standard drinks of alcohol    Types: 4 - 7 Standard drinks or equivalent per week   Drug use: Never    Review of Systems:   Review of Systems  Constitutional:  Negative for chills, fever, malaise/fatigue and weight loss.  HENT:  Negative for hearing loss, sinus pain and sore throat.   Respiratory:  Negative for cough and hemoptysis.   Cardiovascular:   Negative for chest pain, palpitations, leg swelling and PND.  Gastrointestinal:  Negative for abdominal pain, constipation, diarrhea, heartburn, nausea and vomiting.  Genitourinary:  Negative for dysuria, frequency and urgency.  Musculoskeletal:  Negative for back pain, myalgias and neck pain.  Skin:  Negative for itching and rash.  Neurological:  Negative for dizziness, tingling, seizures and headaches.  Endo/Heme/Allergies:  Negative for polydipsia.  Psychiatric/Behavioral:  Negative for depression. The patient is not nervous/anxious.     Objective:   BP 130/76   Pulse 76   Temp 97.7 F (36.5 C) (Temporal)   Ht 5\' 9"  (1.753 m)   Wt 220 lb 8 oz (100 kg)   SpO2 97%   BMI 32.56 kg/m  Body mass index is 32.56 kg/m.   General Appearance:    Alert, cooperative, no distress, appears stated age  Head:    Normocephalic, without obvious abnormality, atraumatic  Eyes:    PERRL, conjunctiva/corneas clear, EOM's intact, fundi    benign, both eyes  Ears:    Bilateral cerumen impaction  Nose:   Nares normal, septum midline, mucosa normal, no drainage  or sinus tenderness  Throat:   Lips, mucosa, and tongue normal; teeth and gums normal  Neck:   Supple, symmetrical, trachea midline, no adenopathy;    thyroid:  no enlargement/tenderness/nodules; no carotid   bruit or JVD  Back:     Symmetric, no curvature, ROM normal, no CVA tenderness  Lungs:     Clear to auscultation bilaterally, respirations unlabored  Chest Wall:    No tenderness or deformity   Heart:    Regular rate and rhythm, S1 and S2 normal, no murmur, rub or gallop  Breast Exam:    Deferred  Abdomen:     Soft, non-tender, bowel sounds active all four quadrants,    no masses, no organomegaly  Genitalia:    Deferred  Extremities:   Extremities normal, atraumatic, no cyanosis or edema  Pulses:   2+ and symmetric all extremities  Skin:   Skin color, texture, turgor normal, no rashes or lesions  Lymph nodes:   Cervical,  supraclavicular, and axillary nodes normal  Neurologic:   CNII-XII intact, normal strength, sensation and reflexes    throughout   Ceruminosis is noted.  Wax is removed by syringing and manual debridement. Attempt made -- see in A/P  Assessment/Plan:   Routine physical examination Today patient counseled on age appropriate routine health concerns for screening and prevention, each reviewed and up to date or declined.  Immunizations reviewed and up to date or declined. Labs ordered and reviewed. Risk factors for depression reviewed and negative. Hearing function and visual acuity are intact. ADLs screened and addressed as needed. Functional ability and level of safety reviewed and appropriate. Education, counseling and referrals performed based on assessed risks today. Patient provided with a copy of personalized plan for preventive services.  Anxiety; Depression, major, single episode, mild (HCC) Well controlled Continue wellbutrin 300 mg daily and celexa 40 mg daily  Encounter for lipid screening for cardiovascular disease Update lipid panel and provide recommendations accordingly  Bilateral impacted cerumen Lavage completed successfully on right but not on left Referral to ENT, has required this in the past  Inda Coke, PA-C [place]

## 2022-09-21 ENCOUNTER — Other Ambulatory Visit: Payer: Self-pay | Admitting: Physician Assistant

## 2022-11-22 NOTE — Progress Notes (Signed)
52 y.o. [id number] Single Declined Not Hispanic or Latino female here for annual exam.  Sexually active, same long term partner. No dyspareunia.  No bowel or bladder c/o.    H/O hysterectomy, H/O VAIN I.  H/O abnormal vaginal pap smears and colposcopies when living in [place]. Never had any treatment.  She is immunocompromised.    On oral ERT, didn't tolerate the patch. Wants to continue it. Will consider decreasing her dose.   H/O lupus, no h/o blood clots, no antiphospholipid antibodies.   No LMP recorded. Patient has had a hysterectomy.          Sexually active: Yes.    The current method of family planning is status post hysterectomy.    Exercising: Yes.     walking Smoker:  no  Health Maintenance: Pap:  11/24/21 ASCUS Hr HPV neg, 10/29/20 LSIL Hr HPV Neg,  History of abnormal Pap:  yes, 2/22 VAIN I MMG:  07/20/22 Density B Bi-rads 1 neg  BMD:   n/a Colonoscopy: 07/21/22 f/u 10 years  TDaP:  up to date per patient  Gardasil: n/a   reports that she has never smoked. She has never used smokeless tobacco. She reports current alcohol use of about 4.0 - 7.0 standard drinks of alcohol per week. She reports that she does not use drugs. Disabled with the lupus. Daughters are grown (one in college, one working out of state).   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      azaTHIOprine (IMURAN) 50 MG tablet Take 150 mg by mouth daily.     baclofen (LIORESAL) 20 MG tablet Take 20 mg by mouth daily.      buPROPion (WELLBUTRIN XL) 300 MG 24 hr tablet TAKE 1 TABLET BY MOUTH  DAILY 100 tablet 1   Calcium Carbonate-Vit D-Min (CALCIUM 1200 PO) Take by mouth.     citalopram (CELEXA) 40 MG tablet TAKE 1 TABLET BY MOUTH  DAILY 90 tablet 1   Collagen-Vitamin C-Biotin (COLLAGEN 1500/C PO) Take by mouth.     estradiol (ESTRACE) 1 MG tablet Take 1 tablet (1 mg total) by mouth daily. Order # [id number] 100 tablet 2   gabapentin (NEURONTIN) 300 MG capsule Take 1,800 mg by mouth 3 (three) times daily.     hydrocortisone 1 % ointment Apply to lesion up to 2x/day     hydroxychloroquine (PLAQUENIL) 200 MG tablet Take 400 mg by mouth daily.     methadone (DOLOPHINE) 10 MG tablet Take 40 mg by mouth daily. Prescribed by [facility]     nabumetone (RELAFEN) 500 MG tablet TAKE 1  TABLET BY MOUTH TWO  TIMES DAILY AS NEEDED     nitrofurantoin (MACRODANTIN) 50 MG capsule Take 50 mg by mouth daily.     ondansetron (ZOFRAN) 4 MG tablet Take 1 tablet (4 mg total) by mouth as directed for 2 doses. Take one Zofran 4 mg tablet 30-60 minutes before each prep dose 2 tablet 0   No current facility-administered medications for this visit.    Family History  Problem Relation Age of Onset   Heart disease Mother    Hypertension Mother    Diabetes Brother    Heart disease Maternal Grandfather    Heart attack Maternal Grandfather    Colon cancer Neg Hx    Colon polyps Neg Hx    Esophageal cancer Neg Hx    Rectal cancer Neg Hx    Stomach cancer Neg Hx     Review of Systems  All other systems reviewed and are negative.   Exam:   BP 112/64   Pulse 77   Ht 5' 8.5" (1.74 m)   Wt 220 lb (99.8 kg)   SpO2 100%   BMI 32.96 kg/m   Weight change: @WEIGHTCHANGE @ Height:   Height: 5' 8.5" (174 cm)  Ht  Readings from Last 3 Encounters:  11/30/22 5' 8.5" (1.74 m)  08/25/22 5\' 9"  (1.753 m)  07/21/22 5\' 9"  (1.753 m)    General appearance: alert, cooperative and appears stated age Head: Normocephalic, without obvious abnormality, atraumatic Neck: no adenopathy, supple, symmetrical, trachea midline and thyroid normal to inspection and palpation Lungs: clear to auscultation bilaterally Cardiovascular: regular rate and rhythm Breasts: normal appearance, no masses or tenderness Abdomen: soft, non-tender; non distended,  no masses,  no organomegaly Extremities: extremities normal, atraumatic, no cyanosis or edema Skin: Skin color, texture, turgor normal. No rashes or lesions Lymph nodes: Cervical, supraclavicular, and axillary nodes normal. No abnormal inguinal nodes palpated Neurologic: Grossly normal   Pelvic: External genitalia:  no lesions              Urethra:  normal appearing urethra with no masses, tenderness or lesions              Bartholins and Skenes: normal                 Vagina: normal appearing vagina with normal color and discharge, no lesions              Cervix: absent               Bimanual Exam:  Uterus:  uterus absent              Adnexa: no mass, fullness, tenderness               Rectovaginal: Confirms               Anus:  normal sphincter tone, no lesions  , CMA chaperoned for the exam.  1. Encounter for breast and pelvic examination Discussed breast self exam Discussed calcium and vit D intake Labs with primary  Mammogram and colonoscopy are UTD  2. Screening for vaginal cancer - Cytology - PAP  3. History of vaginal dysplasia - Cytology - PAP  4. Encounter for monitoring postmenopausal estrogen replacement therapy Reviewed the risks of blood clot, stroke, MI and breast cancer. She wants to continue. Unable to tolerate transdermal ERT - estradiol (ESTRACE) 1 MG tablet; Take 1 tablet (1 mg total) by mouth daily. Order # 07/23/22  Dispense:  90 tablet;  Refill: 3 She will consider going down to 1/2 a tablet a day

## 2022-11-24 DIAGNOSIS — M3219 Other organ or system involvement in systemic lupus erythematosus: Secondary | ICD-10-CM | POA: Diagnosis not present

## 2022-11-24 DIAGNOSIS — Z79899 Other long term (current) drug therapy: Secondary | ICD-10-CM | POA: Diagnosis not present

## 2022-11-30 ENCOUNTER — Ambulatory Visit (INDEPENDENT_AMBULATORY_CARE_PROVIDER_SITE_OTHER): Payer: Medicare Other | Admitting: Obstetrics and Gynecology

## 2022-11-30 ENCOUNTER — Encounter: Payer: Self-pay | Admitting: Obstetrics and Gynecology

## 2022-11-30 ENCOUNTER — Other Ambulatory Visit (HOSPITAL_COMMUNITY)
Admission: RE | Admit: 2022-11-30 | Discharge: 2022-11-30 | Disposition: A | Discharge status: Home / Self Care | Payer: Medicare Other | Source: Ambulatory Visit | Attending: Obstetrics and Gynecology | Admitting: Obstetrics and Gynecology

## 2022-11-30 VITALS — BP 112/64 | HR 77 | Ht 68.5 in | Wt 220.0 lb

## 2022-11-30 DIAGNOSIS — Z01411 Encounter for gynecological examination (general) (routine) with abnormal findings: Secondary | ICD-10-CM | POA: Insufficient documentation

## 2022-11-30 DIAGNOSIS — Z9189 Other specified personal risk factors, not elsewhere classified: Secondary | ICD-10-CM

## 2022-11-30 DIAGNOSIS — Z79890 Hormone replacement therapy (postmenopausal): Secondary | ICD-10-CM | POA: Diagnosis not present

## 2022-11-30 DIAGNOSIS — R87612 Low grade squamous intraepithelial lesion on cytologic smear of cervix (LGSIL): Secondary | ICD-10-CM | POA: Diagnosis not present

## 2022-11-30 DIAGNOSIS — Z791 Long term (current) use of non-steroidal anti-inflammatories (NSAID): Secondary | ICD-10-CM | POA: Diagnosis not present

## 2022-11-30 DIAGNOSIS — Z1151 Encounter for screening for human papillomavirus (HPV): Secondary | ICD-10-CM | POA: Insufficient documentation

## 2022-11-30 DIAGNOSIS — Z5181 Encounter for therapeutic drug level monitoring: Secondary | ICD-10-CM | POA: Diagnosis not present

## 2022-11-30 DIAGNOSIS — Z1272 Encounter for screening for malignant neoplasm of vagina: Secondary | ICD-10-CM

## 2022-11-30 DIAGNOSIS — Z124 Encounter for screening for malignant neoplasm of cervix: Secondary | ICD-10-CM | POA: Insufficient documentation

## 2022-11-30 DIAGNOSIS — Z79624 Long term (current) use of inhibitors of nucleotide synthesis: Secondary | ICD-10-CM | POA: Diagnosis not present

## 2022-11-30 DIAGNOSIS — Z87411 Personal history of vaginal dysplasia: Secondary | ICD-10-CM | POA: Diagnosis present

## 2022-11-30 DIAGNOSIS — Z01419 Encounter for gynecological examination (general) (routine) without abnormal findings: Secondary | ICD-10-CM

## 2022-11-30 DIAGNOSIS — Z79899 Other long term (current) drug therapy: Secondary | ICD-10-CM | POA: Diagnosis not present

## 2022-11-30 DIAGNOSIS — M3219 Other organ or system involvement in systemic lupus erythematosus: Secondary | ICD-10-CM | POA: Diagnosis not present

## 2022-11-30 DIAGNOSIS — M064 Inflammatory polyarthropathy: Secondary | ICD-10-CM | POA: Diagnosis not present

## 2022-11-30 MED ORDER — ESTRADIOL 1 MG PO TABS: 1.0000 mg | ORAL_TABLET | Freq: Every day | ORAL | 3 refills | Status: DC

## 2022-11-30 NOTE — Patient Instructions (Signed)

## 2022-12-05 LAB — CYTOLOGY - PAP
Comment: NEGATIVE
High risk HPV: NEGATIVE

## 2022-12-27 ENCOUNTER — Telehealth: Payer: Self-pay | Admitting: Physician Assistant

## 2022-12-27 NOTE — Telephone Encounter (Signed)
Copied from [place] [id number]. Topic: Medicare AWV >> Dec 27, 2022 12:32 PM Gillis Santa wrote: Reason for CRM: LVM PATIENT TO CALL [phone] TO SCHEDULE AWVI [place]

## 2023-01-10 ENCOUNTER — Other Ambulatory Visit: Payer: Self-pay | Admitting: Physician Assistant

## 2023-02-08 DIAGNOSIS — M542 Cervicalgia: Secondary | ICD-10-CM | POA: Diagnosis not present

## 2023-02-08 DIAGNOSIS — G8929 Other chronic pain: Secondary | ICD-10-CM | POA: Diagnosis not present

## 2023-02-08 DIAGNOSIS — M25512 Pain in left shoulder: Secondary | ICD-10-CM | POA: Diagnosis not present

## 2023-02-08 DIAGNOSIS — M25511 Pain in right shoulder: Secondary | ICD-10-CM | POA: Diagnosis not present

## 2023-03-01 ENCOUNTER — Telehealth: Payer: Self-pay | Admitting: Physician Assistant

## 2023-03-01 NOTE — Telephone Encounter (Signed)
Copied from CRM [id number]. Topic: Medicare AWV >> Mar 01, 2023 11:39 AM Gwenith Spitz wrote: Reason for CRM: Called patient to schedule Medicare Annual Wellness Visit (AWV). Left message for patient to call back and schedule Medicare Annual Wellness Visit (AWV).  Last date of AWV: N/A  Please schedule an appointment at any time with Inetta Fermo, [facility]. Please schedule AWVS with Inetta Fermo, [facility].  If any questions, please contact me at [phone].  Thank you ,  Gabriel Cirri [facility] AWV TEAM Direct Dial [phone]

## 2023-03-02 ENCOUNTER — Other Ambulatory Visit: Payer: Self-pay

## 2023-03-02 DIAGNOSIS — R87622 Low grade squamous intraepithelial lesion on cytologic smear of vagina (LGSIL): Secondary | ICD-10-CM

## 2023-03-06 ENCOUNTER — Encounter: Payer: Self-pay | Admitting: Obstetrics and Gynecology

## 2023-03-11 ENCOUNTER — Other Ambulatory Visit: Payer: Self-pay | Admitting: Physician Assistant

## 2023-03-20 ENCOUNTER — Telehealth: Payer: Self-pay | Admitting: Obstetrics and Gynecology

## 2023-03-20 NOTE — Telephone Encounter (Signed)
Dr Oscar La place colposcopy order on April 11 for patient to schedule. I tried to call on April 12 and 15 call would not go through. I mailed letter on April 15 for patient to call and schedule appointment; patient has not called back.

## 2023-03-27 NOTE — Telephone Encounter (Signed)
Call to patients "home" number, left detailed message, ok per dpr. Advised calling to f/u on recommended colposcopy from [facility] 11/30/22. Left message to call Noreene Larsson, RN at [facility], [phone], option 5.

## 2023-03-31 DIAGNOSIS — S728X1A Other fracture of right femur, initial encounter for closed fracture: Secondary | ICD-10-CM | POA: Diagnosis not present

## 2023-03-31 DIAGNOSIS — Z79899 Other long term (current) drug therapy: Secondary | ICD-10-CM | POA: Diagnosis not present

## 2023-03-31 DIAGNOSIS — M9701XA Periprosthetic fracture around internal prosthetic right hip joint, initial encounter: Secondary | ICD-10-CM | POA: Diagnosis not present

## 2023-03-31 DIAGNOSIS — M329 Systemic lupus erythematosus, unspecified: Secondary | ICD-10-CM | POA: Diagnosis not present

## 2023-03-31 DIAGNOSIS — M159 Polyosteoarthritis, unspecified: Secondary | ICD-10-CM | POA: Diagnosis not present

## 2023-04-20 NOTE — Progress Notes (Signed)
GYNECOLOGY  VISIT   HPI: 52 y.o.   Single Declined Not Hispanic or Latino  female   [phone] with No LMP recorded. Patient has had a hysterectomy.   here for colposcopy d/t pap returning LGSIL, HRHPV-neg in 11/2022. 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 She is immunocompromised.   GYNECOLOGIC HISTORY: No LMP recorded. Patient has had a hysterectomy. 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) 06/27/2017   Other forms of systemic lupus erythematosus (HCC) 05/23/2017   Other autoimmune hemolytic anemias 05/23/2017   Sjogren's syndrome (HCC) 05/23/2017   Atypical endocervical cells on Pap smear 05/23/2017    Past Medical History:  Diagnosis Date   Abnormal Pap smear of vagina 02/05/2019   LGSIL   Allergy    Anxiety    Arthritis    Atypical endocervical cells on Pap smear 05/23/2017   Depression    Gestational diabetes    Lupus (HCC)    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      azaTHIOprine (IMURAN) 50 MG tablet Take 150 mg by mouth daily.     baclofen (LIORESAL) 20 MG tablet Take 20 mg by mouth  daily.      buPROPion (WELLBUTRIN XL) 300 MG 24 hr tablet TAKE 1 TABLET BY MOUTH DAILY 100 tablet 1   Calcium Carbonate-Vit D-Min (CALCIUM 1200 PO) Take by mouth.     citalopram (CELEXA) 40 MG tablet TAKE 1 TABLET BY MOUTH DAILY 90 tablet 1   Collagen-Vitamin C-Biotin (COLLAGEN 1500/C PO) Take by mouth.     estradiol (ESTRACE) 1 MG tablet Take 1 tablet (1 mg total) by mouth daily. Order # [id number] 90 tablet 3   gabapentin (NEURONTIN) 300 MG capsule Take 1,800 mg by mouth 3 (three) times daily.     hydrocortisone 1 % ointment Apply to lesion up to 2x/day     HYDROmorphone (DILAUDID) 2 MG tablet Take by mouth.     hydroxychloroquine (PLAQUENIL) 200 MG tablet Take 400 mg by mouth daily.     methadone (DOLOPHINE) 10 MG tablet Take 40 mg by mouth daily. Prescribed by [facility]     nabumetone (RELAFEN) 500 MG tablet TAKE 1 TABLET BY MOUTH TWO  TIMES DAILY AS NEEDED     naloxone (NARCAN) nasal spray 4 mg/0.1 mL Place into the nose.     nitrofurantoin (MACRODANTIN) 50 MG capsule Take 50 mg by mouth daily.     ondansetron (ZOFRAN) 4 MG tablet Take 1 tablet (4 mg total) by mouth as directed for 2 doses. Take one Zofran 4 mg tablet 30-60 minutes before each prep dose 2 tablet 0   No current facility-administered medications for this visit.     ALLERGIES: Penicillins, Sulfa antibiotics, Tramadol, and Trazodone and nefazodone  Family History  Problem Relation Age of Onset   Heart disease Mother    Hypertension Mother    Diabetes Brother    Heart disease Maternal Grandfather    Heart attack Maternal Grandfather    Colon cancer Neg Hx    Colon polyps Neg Hx    Esophageal cancer Neg Hx    Rectal cancer Neg Hx    Stomach cancer Neg Hx     Social History   Socioeconomic History   Marital status: Single    Spouse name: Not on file   Number of children: Not on file   Years of education: Not on file   Highest education level: Not on file  Occupational History   Not on file   Tobacco Use   Smoking status: Never   Smokeless tobacco: Never  Vaping Use   Vaping Use: Never used  Substance and Sexual Activity   Alcohol use: Yes    Alcohol/week: 4.0 - 7.0 standard drinks of alcohol    Types: 4 - 7 Standard drinks or equivalent per week   Drug use: Never   Sexual activity: Yes    Partners: Male    Birth control/protection: Surgical    Comment: hysterectomy  Other Topics Concern   Not on file  Social History Narrative   Not on file   Social Determinants of Health   Financial Resource Strain: Not on file  Food Insecurity: Not on file  Transportation Needs: Not on file  Physical Activity: Not on file  Stress: Not on file  Social Connections: Not on file  Intimate Partner Violence: Not on file    Review of Systems  All other systems reviewed and are negative.   PHYSICAL EXAMINATION:    BP 122/72   Pulse 83   SpO2 97%     General appearance: alert, cooperative and appears stated age  Pelvic: External genitalia:  no lesions              Urethra:  normal appearing urethra with no masses, tenderness or lesions              Bartholins and Skenes: normal                 Vagina: normal appearing vagina with normal color and discharge, no lesions              Cervix: absent  Colposcopy of entire vagina. No aceto-white changes. Decreased lugols uptake on the right vaginal side wall at 9 o'clock, area was raised. Biopsy taken. Small area of decreased lugols uptake at the vaginal apex, biopsy taken. Minimal bleeding.               Chaperone, Jodelle Red, CMA was present for exam.  1. 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 History of vaginal dysplasia - Surgical pathology( [facility]/ [facility])

## 2023-04-21 ENCOUNTER — Other Ambulatory Visit (HOSPITAL_COMMUNITY)
Admission: RE | Admit: 2023-04-21 | Discharge: 2023-04-21 | Disposition: A | Discharge status: Home / Self Care | Payer: Medicare Other | Source: Ambulatory Visit | Attending: Obstetrics and Gynecology | Admitting: Obstetrics and Gynecology

## 2023-04-21 ENCOUNTER — Ambulatory Visit: Payer: Medicare Other | Admitting: Obstetrics and Gynecology

## 2023-04-21 ENCOUNTER — Encounter: Payer: Self-pay | Admitting: Obstetrics and Gynecology

## 2023-04-21 VITALS — BP 122/72 | HR 83

## 2023-04-21 DIAGNOSIS — R87622 Low grade squamous intraepithelial lesion on cytologic smear of vagina (LGSIL): Secondary | ICD-10-CM

## 2023-04-21 DIAGNOSIS — Z87411 Personal history of vaginal dysplasia: Secondary | ICD-10-CM

## 2023-04-21 NOTE — Patient Instructions (Signed)

## 2023-04-25 DIAGNOSIS — M329 Systemic lupus erythematosus, unspecified: Secondary | ICD-10-CM | POA: Diagnosis not present

## 2023-04-25 LAB — SURGICAL PATHOLOGY

## 2023-04-28 NOTE — Telephone Encounter (Signed)
Patient was seen in office on 04/21/23 for colpo.   Encounter closed.

## 2023-05-03 DIAGNOSIS — M25511 Pain in right shoulder: Secondary | ICD-10-CM | POA: Diagnosis not present

## 2023-05-03 DIAGNOSIS — M47816 Spondylosis without myelopathy or radiculopathy, lumbar region: Secondary | ICD-10-CM | POA: Diagnosis not present

## 2023-05-03 DIAGNOSIS — Z79899 Other long term (current) drug therapy: Secondary | ICD-10-CM | POA: Diagnosis not present

## 2023-05-03 DIAGNOSIS — M25512 Pain in left shoulder: Secondary | ICD-10-CM | POA: Diagnosis not present

## 2023-05-03 DIAGNOSIS — Z79891 Long term (current) use of opiate analgesic: Secondary | ICD-10-CM | POA: Diagnosis not present

## 2023-05-03 DIAGNOSIS — M542 Cervicalgia: Secondary | ICD-10-CM | POA: Diagnosis not present

## 2023-05-03 DIAGNOSIS — Z5181 Encounter for therapeutic drug level monitoring: Secondary | ICD-10-CM | POA: Diagnosis not present

## 2023-05-03 DIAGNOSIS — M5136 Other intervertebral disc degeneration, lumbar region: Secondary | ICD-10-CM | POA: Diagnosis not present

## 2023-05-03 DIAGNOSIS — G8929 Other chronic pain: Secondary | ICD-10-CM | POA: Diagnosis not present

## 2023-06-24 ENCOUNTER — Other Ambulatory Visit: Payer: Self-pay | Admitting: Physician Assistant

## 2023-06-26 ENCOUNTER — Other Ambulatory Visit: Payer: Self-pay | Admitting: Physician Assistant

## 2023-06-26 DIAGNOSIS — Z1231 Encounter for screening mammogram for malignant neoplasm of breast: Secondary | ICD-10-CM

## 2023-07-04 DIAGNOSIS — N390 Urinary tract infection, site not specified: Secondary | ICD-10-CM | POA: Diagnosis not present

## 2023-07-26 ENCOUNTER — Ambulatory Visit: Admission: RE | Admit: 2023-07-26 | Payer: Medicare Other | Source: Ambulatory Visit

## 2023-07-26 DIAGNOSIS — Z1231 Encounter for screening mammogram for malignant neoplasm of breast: Secondary | ICD-10-CM | POA: Diagnosis not present

## 2023-08-01 DIAGNOSIS — M79601 Pain in right arm: Secondary | ICD-10-CM | POA: Diagnosis not present

## 2023-08-01 DIAGNOSIS — M542 Cervicalgia: Secondary | ICD-10-CM | POA: Diagnosis not present

## 2023-08-01 DIAGNOSIS — M79604 Pain in right leg: Secondary | ICD-10-CM | POA: Diagnosis not present

## 2023-08-01 DIAGNOSIS — M79605 Pain in left leg: Secondary | ICD-10-CM | POA: Diagnosis not present

## 2023-08-01 DIAGNOSIS — M25511 Pain in right shoulder: Secondary | ICD-10-CM | POA: Diagnosis not present

## 2023-08-01 DIAGNOSIS — M25561 Pain in right knee: Secondary | ICD-10-CM | POA: Diagnosis not present

## 2023-08-01 DIAGNOSIS — M47816 Spondylosis without myelopathy or radiculopathy, lumbar region: Secondary | ICD-10-CM | POA: Diagnosis not present

## 2023-08-01 DIAGNOSIS — M2550 Pain in unspecified joint: Secondary | ICD-10-CM | POA: Diagnosis not present

## 2023-08-01 DIAGNOSIS — Z79891 Long term (current) use of opiate analgesic: Secondary | ICD-10-CM | POA: Diagnosis not present

## 2023-08-01 DIAGNOSIS — M5136 Other intervertebral disc degeneration, lumbar region: Secondary | ICD-10-CM | POA: Diagnosis not present

## 2023-08-01 DIAGNOSIS — G8929 Other chronic pain: Secondary | ICD-10-CM | POA: Diagnosis not present

## 2023-08-01 DIAGNOSIS — G894 Chronic pain syndrome: Secondary | ICD-10-CM | POA: Diagnosis not present

## 2023-08-02 ENCOUNTER — Other Ambulatory Visit: Payer: Self-pay | Admitting: Physician Assistant

## 2023-08-03 DIAGNOSIS — M329 Systemic lupus erythematosus, unspecified: Secondary | ICD-10-CM | POA: Diagnosis not present

## 2023-08-03 DIAGNOSIS — Z79899 Other long term (current) drug therapy: Secondary | ICD-10-CM | POA: Diagnosis not present

## 2023-08-03 DIAGNOSIS — Z79624 Long term (current) use of inhibitors of nucleotide synthesis: Secondary | ICD-10-CM | POA: Diagnosis not present

## 2023-08-10 DIAGNOSIS — H524 Presbyopia: Secondary | ICD-10-CM | POA: Diagnosis not present

## 2023-08-10 DIAGNOSIS — H52223 Regular astigmatism, bilateral: Secondary | ICD-10-CM | POA: Diagnosis not present

## 2023-08-10 DIAGNOSIS — H5203 Hypermetropia, bilateral: Secondary | ICD-10-CM | POA: Diagnosis not present

## 2023-08-10 DIAGNOSIS — Z79899 Other long term (current) drug therapy: Secondary | ICD-10-CM | POA: Diagnosis not present

## 2023-08-10 DIAGNOSIS — M3501 Sicca syndrome with keratoconjunctivitis: Secondary | ICD-10-CM | POA: Diagnosis not present

## 2023-08-10 DIAGNOSIS — M329 Systemic lupus erythematosus, unspecified: Secondary | ICD-10-CM | POA: Diagnosis not present

## 2023-09-26 DIAGNOSIS — G894 Chronic pain syndrome: Secondary | ICD-10-CM | POA: Diagnosis not present

## 2023-09-26 DIAGNOSIS — M47816 Spondylosis without myelopathy or radiculopathy, lumbar region: Secondary | ICD-10-CM | POA: Diagnosis not present

## 2023-09-26 DIAGNOSIS — M542 Cervicalgia: Secondary | ICD-10-CM | POA: Diagnosis not present

## 2023-09-26 DIAGNOSIS — M25511 Pain in right shoulder: Secondary | ICD-10-CM | POA: Diagnosis not present

## 2023-09-26 DIAGNOSIS — Z5181 Encounter for therapeutic drug level monitoring: Secondary | ICD-10-CM | POA: Diagnosis not present

## 2023-09-26 DIAGNOSIS — Z79891 Long term (current) use of opiate analgesic: Secondary | ICD-10-CM | POA: Diagnosis not present

## 2023-09-26 DIAGNOSIS — M25512 Pain in left shoulder: Secondary | ICD-10-CM | POA: Diagnosis not present

## 2023-09-26 DIAGNOSIS — M2550 Pain in unspecified joint: Secondary | ICD-10-CM | POA: Diagnosis not present

## 2023-09-26 DIAGNOSIS — Z79899 Other long term (current) drug therapy: Secondary | ICD-10-CM | POA: Diagnosis not present

## 2023-11-29 DIAGNOSIS — M47816 Spondylosis without myelopathy or radiculopathy, lumbar region: Secondary | ICD-10-CM | POA: Diagnosis not present

## 2023-11-29 DIAGNOSIS — M25512 Pain in left shoulder: Secondary | ICD-10-CM | POA: Diagnosis not present

## 2023-11-29 DIAGNOSIS — G894 Chronic pain syndrome: Secondary | ICD-10-CM | POA: Diagnosis not present

## 2023-11-29 DIAGNOSIS — M2550 Pain in unspecified joint: Secondary | ICD-10-CM | POA: Diagnosis not present

## 2023-11-29 DIAGNOSIS — Z79891 Long term (current) use of opiate analgesic: Secondary | ICD-10-CM | POA: Diagnosis not present

## 2023-11-29 DIAGNOSIS — M542 Cervicalgia: Secondary | ICD-10-CM | POA: Diagnosis not present

## 2023-11-29 DIAGNOSIS — M25511 Pain in right shoulder: Secondary | ICD-10-CM | POA: Diagnosis not present

## 2023-12-19 ENCOUNTER — Other Ambulatory Visit (HOSPITAL_COMMUNITY)
Admission: RE | Admit: 2023-12-19 | Discharge: 2023-12-19 | Disposition: A | Discharge status: Home / Self Care | Payer: Medicare Other | Source: Ambulatory Visit | Attending: Nurse Practitioner | Admitting: Nurse Practitioner

## 2023-12-19 ENCOUNTER — Ambulatory Visit (INDEPENDENT_AMBULATORY_CARE_PROVIDER_SITE_OTHER): Payer: Medicare Other | Admitting: Nurse Practitioner

## 2023-12-19 ENCOUNTER — Encounter: Payer: Self-pay | Admitting: Nurse Practitioner

## 2023-12-19 VITALS — BP 122/70 | HR 88 | Ht 69.0 in | Wt 228.0 lb

## 2023-12-19 DIAGNOSIS — Z124 Encounter for screening for malignant neoplasm of cervix: Secondary | ICD-10-CM | POA: Diagnosis not present

## 2023-12-19 DIAGNOSIS — Z01411 Encounter for gynecological examination (general) (routine) with abnormal findings: Secondary | ICD-10-CM | POA: Diagnosis present

## 2023-12-19 DIAGNOSIS — Z1151 Encounter for screening for human papillomavirus (HPV): Secondary | ICD-10-CM | POA: Diagnosis not present

## 2023-12-19 DIAGNOSIS — Z79890 Hormone replacement therapy (postmenopausal): Secondary | ICD-10-CM | POA: Diagnosis not present

## 2023-12-19 DIAGNOSIS — Z01419 Encounter for gynecological examination (general) (routine) without abnormal findings: Secondary | ICD-10-CM

## 2023-12-19 DIAGNOSIS — Z9189 Other specified personal risk factors, not elsewhere classified: Secondary | ICD-10-CM | POA: Diagnosis not present

## 2023-12-19 DIAGNOSIS — N890 Mild vaginal dysplasia: Secondary | ICD-10-CM

## 2023-12-19 MED ORDER — ESTRADIOL 1 MG PO TABS: 1.0000 mg | ORAL_TABLET | Freq: Every day | ORAL | 3 refills | Status: DC

## 2023-12-19 NOTE — Progress Notes (Signed)
Kerri Young July 20, 1971 [id number]   History:  53 y.o. [id number] presents for breast and pelvic exam. S/P 2008 TAH BSO for benign reasons. Oral estradiol, did not tolerate patch. 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 H/O Lupus, Sjogren's, gestational diabetes, anxiety.   Gynecologic History No LMP recorded. Patient has had a hysterectomy.   Contraception/Family planning: status post hysterectomy Sexually active: Yes  Health Maintenance Last Pap: 11/30/2022. Results were: LGSIL neg HR HPV Last mammogram: 07/26/2023. Results were: Normal Last colonoscopy: 07/21/2022. Results were: Normal, 10-year recall Last Dexa: Not indicated Exercising: Yes. walking  Smoker: no   Past medical history, past surgical history, family history and social history were all reviewed and documented in the EPIC chart. On disability for Lupus. 56 yo daughter, lives with her. 75 yo daughter, in [place].   ROS:  A ROS was performed and pertinent positives and negatives are included.  Exam:  Vitals:   12/19/23 1151  BP: 122/70  Pulse: 88  SpO2: 98%  Weight: 228 lb (103.4 kg)  Height: 5\' 9"  (1.753 m)   Body mass index is 33.67 kg/m.   General appearance:  Normal Thyroid:  Symmetrical, normal in size, without palpable masses or nodularity. Respiratory  Auscultation:  Clear without wheezing or rhonchi Cardiovascular  Auscultation:  Regular rate, without rubs, murmurs or gallops  Edema/varicosities:  Not grossly evident Abdominal  Soft,nontender, without masses, guarding or rebound.  Liver/spleen:  No organomegaly noted  Hernia:  None appreciated  Skin  Inspection:  Grossly normal Breasts: Examined lying and sitting.   Right: Without masses, retractions, nipple discharge or axillary adenopathy.   Left: Without masses, retractions, nipple discharge or axillary adenopathy. Pelvic: External genitalia:  no lesions              Urethra:  normal appearing urethra with no masses, tenderness or lesions              Bartholins and  Skenes: normal                 Vagina: normal appearing vagina with normal color and discharge, no lesions              Cervix: absent Bimanual Exam:  Uterus: absent              Adnexa: no mass, fullness, tenderness              Rectovaginal: Deferred              Anus:  normal, no lesions  Patient informed chaperone available to be present for breast and pelvic exam. Patient has requested no chaperone to be present. Patient has been advised what will be completed during breast and pelvic exam.   Assessment/Plan:  53 y.o. [id number] for breast and pelvic exam.    Encounter for breast and pelvic examination - Education provided on SBEs, importance of preventative screenings, current guidelines, high calcium diet, regular exercise, and multivitamin daily. Labs with PCP.   VAIN I (vaginal intraepithelial neoplasia grade I) - Plan: Cytology - PAP( [place]). 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 Vaginal pap today per guidelines.   Postmenopausal hormone therapy - Plan: estradiol (ESTRACE) 1 MG tablet daily. Taking as prescribed. Aware of risks with continued use. Did not tolerate patch.  Screening for breast cancer - Normal mammogram history.  Continue annual screenings.  Normal breast exam today.  Screening for colon cancer - 06/2022 colonoscopy. Will repeat at 10-year interval per GI's recommendation.   Screening for osteoporosis - Average risk. Will plan  DXA at age 51.   Return in about 1 year (around 12/18/2024) for B&P (high risk).   Olivia Mackie DNP, 12:14 PM 12/19/2023

## 2023-12-20 ENCOUNTER — Encounter: Payer: Self-pay | Admitting: Physician Assistant

## 2023-12-20 ENCOUNTER — Telehealth: Payer: Medicare Other | Admitting: Physician Assistant

## 2023-12-20 ENCOUNTER — Ambulatory Visit: Payer: Medicare Other | Admitting: Radiology

## 2023-12-20 DIAGNOSIS — F320 Major depressive disorder, single episode, mild: Secondary | ICD-10-CM | POA: Diagnosis not present

## 2023-12-20 DIAGNOSIS — F419 Anxiety disorder, unspecified: Secondary | ICD-10-CM

## 2023-12-20 MED ORDER — BUPROPION HCL ER (XL) 300 MG PO TB24: 300.0000 mg | ORAL_TABLET | Freq: Every day | ORAL | 3 refills | Status: DC

## 2023-12-20 MED ORDER — CITALOPRAM HYDROBROMIDE 40 MG PO TABS: 40.0000 mg | ORAL_TABLET | Freq: Every day | ORAL | 3 refills | Status: DC

## 2023-12-20 MED ORDER — ALPRAZOLAM 0.5 MG PO TABS: 0.5000 mg | ORAL_TABLET | ORAL | 1 refills | Status: AC | PRN

## 2023-12-20 NOTE — Progress Notes (Signed)
Virtual Visit via Video Note   I, Jarold Motto, PA, connected with  Kerri Young  ([id number], 10/29/1971) on 12/20/23 at  1:20 PM EST by a video-enabled telemedicine application and verified that I am speaking with the correct person using two identifiers.  Location: Patient: Home Provider: [facility] office   I discussed the limitations of evaluation and management by telemedicine and the availability of in person appointments. The patient expressed understanding and agreed to proceed.    History of Present Illness: Kerri Young is a 53 y.o. who identifies as a female who was assigned female at birth, and is being seen today for anxiety & depression.  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) 06/27/2017   Other forms of systemic lupus erythematosus (HCC) 05/23/2017   Other autoimmune hemolytic anemia (HCC) 05/23/2017   Sjogren's syndrome (HCC) 05/23/2017   Atypical endocervical cells on Pap smear 05/23/2017    Allergies:  Allergies  Allergen Reactions   Penicillins Other (See Comments)    Told not to take due to Lupus   Sulfa Antibiotics Other (See Comments)    Told not to take due to Lupus   Tramadol Other (See Comments)    Confusion and disorientation   Trazodone And Nefazodone Hives   Medications:  Current Outpatient Medications:    ALPRAZolam (XANAX) 0.5 MG tablet, Take 1 tablet (0.5 mg  total) by mouth as needed., Disp: 30 tablet, Rfl: 1   azaTHIOprine (IMURAN) 50 MG tablet, Take 150 mg by mouth daily., Disp: , Rfl:    baclofen (LIORESAL) 20 MG tablet, Take 20 mg by mouth daily. , Disp: , Rfl:    buPROPion (WELLBUTRIN XL) 300 MG 24 hr tablet, Take 1 tablet (300 mg total) by mouth daily., Disp: 90 tablet, Rfl: 3   Calcium Carbonate-Vit D-Min (CALCIUM 1200 PO), Take by mouth., Disp: , Rfl:    citalopram (CELEXA) 40 MG tablet, Take 1 tablet (40 mg total) by mouth daily., Disp: 90 tablet, Rfl: 3   Collagen-Vitamin C-Biotin (COLLAGEN 1500/C PO), Take by mouth., Disp: , Rfl:    estradiol (ESTRACE) 1 MG tablet, Take 1 tablet (1 mg total) by mouth daily. Order # [id number], Disp: 90 tablet, Rfl: 3   gabapentin (NEURONTIN) 300 MG capsule, Take 1,800 mg by mouth 3 (three) times daily., Disp: , Rfl:    hydrocortisone 1 % ointment, Apply to lesion up to 2x/day, Disp: , Rfl:    HYDROmorphone (DILAUDID) 2 MG tablet, Take by mouth., Disp: , Rfl:    hydroxychloroquine (PLAQUENIL) 200 MG tablet, Take 400 mg by mouth daily., Disp: , Rfl:    methadone (DOLOPHINE) 10 MG tablet, Take 40 mg by mouth daily. Prescribed by [facility], Disp: , Rfl:    nabumetone (RELAFEN) 500 MG tablet, TAKE 1 TABLET BY MOUTH TWO  TIMES DAILY AS NEEDED, Disp: , Rfl:    naloxone (NARCAN) nasal spray 4 mg/0.1 mL,  Place into the nose., Disp: , Rfl:    nitrofurantoin (MACRODANTIN) 50 MG capsule, Take 50 mg by mouth daily., Disp: , Rfl:    ondansetron (ZOFRAN) 4 MG tablet, Take 1 tablet (4 mg total) by mouth as directed for 2 doses. Take one Zofran 4 mg tablet 30-60 minutes before each prep dose, Disp: 2 tablet, Rfl: 0  Observations/Objective: Patient is well-developed, well-nourished in no acute distress.  Resting comfortably  at home.  Head is normocephalic, atraumatic.  No labored breathing.  Speech is clear and coherent with logical content.  Patient is alert and oriented at baseline.   Assessment and  Plan: Anxiety (Primary); Depression, major, single episode, mild (HCC) Uncontrolled Continue Wellbutrin 300 mg xl and Celexa 40 mg daily Denies suicidal ideation/hi Start xanax 0.5 mg daily as needed Follow-up in 3 month(s) if needs xanax refill I discussed with patient that if they develop any SI, to tell someone immediately and seek medical attention.   Follow Up Instructions: I discussed the assessment and treatment plan with the patient. The patient was provided an opportunity to ask questions and all were answered. The patient agreed with the plan and demonstrated an understanding of the instructions.  A copy of instructions were sent to the patient via MyChart unless otherwise noted below.   The patient was advised to call back or seek an in-person evaluation if the symptoms worsen or if the condition fails to improve as anticipated.  Jarold Motto, [place]

## 2023-12-27 ENCOUNTER — Encounter: Payer: Self-pay | Admitting: Nurse Practitioner

## 2023-12-27 DIAGNOSIS — M329 Systemic lupus erythematosus, unspecified: Secondary | ICD-10-CM | POA: Diagnosis not present

## 2023-12-27 DIAGNOSIS — Z79899 Other long term (current) drug therapy: Secondary | ICD-10-CM | POA: Diagnosis not present

## 2023-12-27 DIAGNOSIS — E010 Iodine-deficiency related diffuse (endemic) goiter: Secondary | ICD-10-CM | POA: Diagnosis not present

## 2023-12-27 DIAGNOSIS — M150 Primary generalized (osteo)arthritis: Secondary | ICD-10-CM | POA: Diagnosis not present

## 2023-12-27 LAB — CYTOLOGY - PAP
Comment: NEGATIVE
Diagnosis: NEGATIVE
Diagnosis: REACTIVE

## 2024-01-24 DIAGNOSIS — M51369 Other intervertebral disc degeneration, lumbar region without mention of lumbar back pain or lower extremity pain: Secondary | ICD-10-CM | POA: Diagnosis not present

## 2024-01-24 DIAGNOSIS — M25512 Pain in left shoulder: Secondary | ICD-10-CM | POA: Diagnosis not present

## 2024-01-24 DIAGNOSIS — M47816 Spondylosis without myelopathy or radiculopathy, lumbar region: Secondary | ICD-10-CM | POA: Diagnosis not present

## 2024-01-24 DIAGNOSIS — Z79891 Long term (current) use of opiate analgesic: Secondary | ICD-10-CM | POA: Diagnosis not present

## 2024-01-24 DIAGNOSIS — M542 Cervicalgia: Secondary | ICD-10-CM | POA: Diagnosis not present

## 2024-01-24 DIAGNOSIS — G894 Chronic pain syndrome: Secondary | ICD-10-CM | POA: Diagnosis not present

## 2024-01-24 DIAGNOSIS — M25511 Pain in right shoulder: Secondary | ICD-10-CM | POA: Diagnosis not present

## 2024-03-20 DIAGNOSIS — M47816 Spondylosis without myelopathy or radiculopathy, lumbar region: Secondary | ICD-10-CM | POA: Diagnosis not present

## 2024-03-20 DIAGNOSIS — G894 Chronic pain syndrome: Secondary | ICD-10-CM | POA: Diagnosis not present

## 2024-03-20 DIAGNOSIS — M542 Cervicalgia: Secondary | ICD-10-CM | POA: Diagnosis not present

## 2024-03-20 DIAGNOSIS — M25511 Pain in right shoulder: Secondary | ICD-10-CM | POA: Diagnosis not present

## 2024-03-20 DIAGNOSIS — M51369 Other intervertebral disc degeneration, lumbar region without mention of lumbar back pain or lower extremity pain: Secondary | ICD-10-CM | POA: Diagnosis not present

## 2024-05-29 DIAGNOSIS — M150 Primary generalized (osteo)arthritis: Secondary | ICD-10-CM | POA: Diagnosis not present

## 2024-05-29 DIAGNOSIS — Z79899 Other long term (current) drug therapy: Secondary | ICD-10-CM | POA: Diagnosis not present

## 2024-05-29 DIAGNOSIS — E010 Iodine-deficiency related diffuse (endemic) goiter: Secondary | ICD-10-CM | POA: Diagnosis not present

## 2024-05-29 DIAGNOSIS — M329 Systemic lupus erythematosus, unspecified: Secondary | ICD-10-CM | POA: Diagnosis not present

## 2024-06-12 DIAGNOSIS — Z79899 Other long term (current) drug therapy: Secondary | ICD-10-CM | POA: Diagnosis not present

## 2024-06-12 DIAGNOSIS — G894 Chronic pain syndrome: Secondary | ICD-10-CM | POA: Diagnosis not present

## 2024-06-12 DIAGNOSIS — M542 Cervicalgia: Secondary | ICD-10-CM | POA: Diagnosis not present

## 2024-06-12 DIAGNOSIS — M51369 Other intervertebral disc degeneration, lumbar region without mention of lumbar back pain or lower extremity pain: Secondary | ICD-10-CM | POA: Diagnosis not present

## 2024-06-12 DIAGNOSIS — Z79891 Long term (current) use of opiate analgesic: Secondary | ICD-10-CM | POA: Diagnosis not present

## 2024-06-12 DIAGNOSIS — Z5181 Encounter for therapeutic drug level monitoring: Secondary | ICD-10-CM | POA: Diagnosis not present

## 2024-06-12 DIAGNOSIS — M47816 Spondylosis without myelopathy or radiculopathy, lumbar region: Secondary | ICD-10-CM | POA: Diagnosis not present

## 2024-06-12 DIAGNOSIS — M2550 Pain in unspecified joint: Secondary | ICD-10-CM | POA: Diagnosis not present

## 2024-06-19 ENCOUNTER — Ambulatory Visit (INDEPENDENT_AMBULATORY_CARE_PROVIDER_SITE_OTHER)

## 2024-06-19 VITALS — Ht 69.0 in | Wt 228.0 lb

## 2024-06-19 DIAGNOSIS — Z0000 Encounter for general adult medical examination without abnormal findings: Secondary | ICD-10-CM | POA: Diagnosis not present

## 2024-06-19 NOTE — Progress Notes (Signed)
 Subjective:   Kerri Young is a 53 y.o. who presents for a Medicare Wellness preventive visit.  As a reminder, Annual Wellness Visits don't include a physical exam, and some assessments may be limited, especially if this visit is performed virtually. We may recommend an in-person follow-up visit with your provider if needed.  Visit Complete: Virtual I connected with  Mardel Hoar on 06/19/24 by a audio enabled telemedicine application and verified that I am speaking with the correct person using two identifiers.  Patient Location: Home  Provider Location: Home Office  I discussed the limitations of evaluation and management by telemedicine. The patient expressed understanding and agreed to proceed.  Vital Signs: Because this visit was a virtual/telehealth visit, some criteria may be missing or patient reported. Any vitals not documented were not able to be obtained and vitals that have been documented are patient reported.  VideoDeclined- This patient declined Librarian, academic. Therefore the visit was completed with audio only.  Persons Participating in Visit: Patient.  AWV Questionnaire: No: Patient Medicare AWV questionnaire was not completed prior to this visit.  Cardiac Risk Factors include: obesity (BMI >30kg/m2)     Objective:    Today's Vitals   06/19/24 0901 06/19/24 0902  Weight: 228 lb (103.4 kg)   Height: 5' 9 (1.753 m)   PainSc:  7    Body mass index is 33.67 kg/m.     06/19/2024    9:09 AM  Advanced Directives  Does Patient Have a Medical Advance Directive? No  Would patient like information on creating a medical advance directive? No - Patient declined    Current Medications (verified) Outpatient Encounter Medications as of 06/19/2024  Medication Sig   ALPRAZolam  (XANAX ) 0.5 MG tablet Take 1 tablet (0.5 mg total) by mouth as needed.   azaTHIOprine (IMURAN) 50 MG tablet Take 150 mg by mouth daily.   baclofen (LIORESAL) 20  MG tablet Take 20 mg by mouth daily.    buPROPion  (WELLBUTRIN  XL) 300 MG 24 hr tablet Take 1 tablet (300 mg total) by mouth daily.   Calcium Carbonate-Vit D-Min (CALCIUM 1200 PO) Take by mouth.   citalopram  (CELEXA ) 40 MG tablet Take 1 tablet (40 mg total) by mouth daily.   Collagen-Vitamin C-Biotin (COLLAGEN 1500/C PO) Take by mouth.   estradiol  (ESTRACE ) 1 MG tablet Take 1 tablet (1 mg total) by mouth daily. Order # [id number]   gabapentin (NEURONTIN) 300 MG capsule Take 1,800 mg by mouth 3 (three) times daily.   hydrocortisone 1 % ointment Apply to lesion up to 2x/day   HYDROmorphone (DILAUDID) 2 MG tablet Take by mouth.   hydroxychloroquine (PLAQUENIL) 200 MG tablet Take 400 mg by mouth daily.   methadone (DOLOPHINE) 10 MG tablet Take 40 mg by mouth daily. Prescribed by [facility]   nabumetone (RELAFEN) 500 MG tablet TAKE 1 TABLET BY MOUTH TWO  TIMES DAILY AS NEEDED   naloxone (NARCAN) nasal spray 4 mg/0.1 mL Place into the nose.   nitrofurantoin  (MACRODANTIN ) 50 MG capsule Take 50 mg by mouth daily.   ondansetron  (ZOFRAN ) 4 MG tablet Take 1 tablet (4 mg total) by mouth as directed for 2 doses. Take one Zofran  4 mg tablet 30-60 minutes before each prep dose   No facility-administered encounter medications on file as of 06/19/2024.    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   Sjogren's syndrome (HCC) 05/23/2017   Past Surgical History:  Procedure Laterality Date   CERVICAL BIOPSY  W/ LOOP ELECTRODE EXCISION     CESAREAN SECTION  1996, 1999   COLPOSCOPY     DILATION AND CURETTAGE OF UTERUS     LAPAROTOMY  2008   x 2    PELVIC  LAPAROSCOPY     TOTAL ABDOMINAL HYSTERECTOMY  2008   TUBAL LIGATION     Family History  Problem Relation Age of Onset   Heart disease Mother    Hypertension Mother    Dementia Mother    Diabetes Brother    Heart disease Maternal Grandfather    Heart attack Maternal Grandfather    Colon cancer Neg Hx    Colon polyps Neg Hx    Esophageal cancer Neg Hx    Rectal cancer Neg Hx    Stomach cancer Neg Hx    Social History   Socioeconomic History   Marital status: Single    Spouse name: Not on file   Number of children: Not on file   Years of education: Not on file   Highest education level: Not on file  Occupational History   Not on file  Tobacco Use   Smoking status: Never   Smokeless tobacco: Never  Vaping Use   Vaping status: Never Used  Substance and Sexual Activity   Alcohol use: Yes    Alcohol/week: 4.0 - 7.0 standard drinks of alcohol    Types: 4 - 7 Standard drinks or equivalent per week   Drug use: Never   Sexual activity: Yes    Partners: Male    Birth control/protection: Surgical    Comment: hysterectomy  Other Topics Concern   Not on file  Social History Narrative   Not on file   Social Drivers of Health   Financial Resource Strain: Low Risk  (06/19/2024)   Overall Financial Resource Strain (CARDIA)    Difficulty of Paying Living Expenses: Not hard at all  Food Insecurity: No Food Insecurity (06/19/2024)   Hunger Vital Sign    Worried About Running Out of Food in the Last Year: Never true    Ran Out of Food in the Last Year: Never true  Transportation Needs: No Transportation Needs (06/19/2024)   PRAPARE - Administrator, Civil Service (Medical): No    Lack of Transportation (Non-Medical): No  Physical Activity: Insufficiently Active (06/19/2024)   Exercise Vital Sign    Days of Exercise per Week: 3 days    Minutes of Exercise per Session: 20 min  Stress: No Stress Concern Present (06/19/2024)   [employer] of Occupational Health -  Occupational Stress Questionnaire    Feeling of Stress: Not at all  Social Connections: Socially Isolated (06/19/2024)   Social Connection and Isolation Panel    Frequency of Communication with Friends and Family: Once a week    Frequency of Social Gatherings with Friends and Family: Once a week    Attends Religious Services: Never    Database administrator or Organizations: No    Attends Banker Meetings: Never    Marital Status: Never married    Tobacco Counseling Counseling given: Not Answered    Clinical Intake:  Pre-visit preparation completed: Yes  Pain : 0-10 Pain Score: 7  Pain Type: Chronic pain Pain Location: Generalized Pain Descriptors / Indicators: Aching Pain Onset: More than a month ago Pain Frequency: Constant     BMI - recorded: 33.67 Nutritional Status: BMI > 30  Obese Nutritional Risks: None Diabetes: No  No results found for: HGBA1C   How often do you need to have someone help you when you read instructions, pamphlets, or other written materials from your doctor or pharmacy?: 1 - Never  Interpreter Needed?: No  Information entered by :: Ellouise Haws, LPN   Activities of Daily Living     06/19/2024    9:04 AM  In your present state of health, do you have any difficulty performing the following activities:  Hearing? 0  Vision? 0  Difficulty concentrating or making decisions? 0  Walking or climbing stairs? 1  Comment related to lupus  Dressing or bathing? 0  Doing errands, shopping? 0  Preparing Food and eating ? N  Using the Toilet? N  In the past six months, have you accidently leaked urine? N  Do you have problems with loss of bowel control? N  Managing your Medications? N  Managing your Finances? N  Housekeeping or managing your Housekeeping? N    Patient Care Team: Job Lukes, [place] as PCP - General (Physician Assistant)  I have updated your Care Teams any recent Medical Services you may have received from  other providers in the past year.     Assessment:   This is a routine wellness examination for [place].  Hearing/Vision screen Hearing Screening - Comments:: Pt denies any hearing issues  Vision Screening - Comments:: Wears rx glasses - up to date with routine eye exams with [facility]    Goals Addressed             This Visit's Progress    Patient Stated       None at this time        Depression Screen     06/19/2024    9:06 AM 12/19/2023   11:50 AM 08/25/2022    9:44 AM 04/02/2019   10:59 AM 11/27/2018   10:24 AM 04/27/2018   10:15 AM 05/23/2017    1:48 PM  PHQ 2/9 Scores  PHQ - 2 Score 1 6 1  0 0 1 0  PHQ- 9 Score   4 4 0 6     Fall Risk     06/19/2024    9:10 AM 12/19/2023   11:50 AM 04/21/2023    9:51 AM 08/25/2022    9:40 AM  Fall Risk   Falls in the past year? 1 1 0 0  Number falls in past yr: 1 1 0 0  Injury with Fall? 1 0 0 0  Comment soreness     Risk for fall due to : Impaired balance/gait;Impaired mobility;History of fall(s) History of fall(s)  No Fall Risks  Follow up  Falls evaluation completed  Falls evaluation completed      Data saved with a previous flowsheet row definition    MEDICARE RISK AT HOME:  Medicare Risk at Home Any stairs in or around the home?: Yes If so, are there any without handrails?: No Home free of loose throw rugs in walkways, pet beds, electrical cords, etc?: Yes Adequate lighting in your home to reduce risk of falls?: Yes Life alert?: No Use of a cane, walker or w/c?: Yes (at times) Grab bars in the bathroom?:  No Shower chair or bench in shower?: No Elevated toilet seat or a handicapped toilet?: No  TIMED UP AND GO:  Was the test performed?  No  Cognitive Function: 6CIT completed        06/19/2024    9:11 AM  6CIT Screen  What Year? 0 points  What month? 0 points  What time? 0 points  Count back from 20 0 points  Months in reverse 0 points  Repeat phrase 0 points  Total Score 0 points    Immunizations There  is no immunization history for the selected administration types on file for this patient.  Screening Tests Health Maintenance  Topic Date Due   COVID-19 Vaccine (1) Never done   HIV Screening  Never done   Hepatitis C Screening  Never done   DTaP/Tdap/Td (1 - Tdap) Never done   Pneumococcal Vaccine 20-23 Years old (1 of 2 - PCV) Never done   Hepatitis B Vaccines (1 of 3 - 19+ 3-dose series) Never done   Zoster Vaccines- Shingrix (1 of 2) Never done   Medicare Annual Wellness (AWV)  06/19/2025   MAMMOGRAM  07/25/2025   Colonoscopy  07/21/2032   HPV VACCINES  Aged Out   Meningococcal B Vaccine  Aged Out   INFLUENZA VACCINE  Discontinued    Health Maintenance  Health Maintenance Due  Topic Date Due   COVID-19 Vaccine (1) Never done   HIV Screening  Never done   Hepatitis C Screening  Never done   DTaP/Tdap/Td (1 - Tdap) Never done   Pneumococcal Vaccine 36-51 Years old (1 of 2 - PCV) Never done   Hepatitis B Vaccines (1 of 3 - 19+ 3-dose series) Never done   Zoster Vaccines- Shingrix (1 of 2) Never done   Health Maintenance Items Addressed: See Nurse Notes at the end of this note  Additional Screening:  Vision Screening: Recommended annual ophthalmology exams for early detection of glaucoma and other disorders of the eye. Would you like a referral to an eye doctor? No    Dental Screening: Recommended annual dental exams for proper oral hygiene  Community Resource Referral / Chronic Care Management: CRR required this visit?  No   CCM required this visit?  No   Plan:    I have personally reviewed and noted the following in the patient's chart:   Medical and social history Use of alcohol, tobacco or illicit drugs  Current medications and supplements including opioid prescriptions. Patient is currently taking opioid prescriptions. Information provided to patient regarding non-opioid alternatives. Patient advised to discuss non-opioid treatment plan with their  provider. Functional ability and status Nutritional status Physical activity Advanced directives List of other physicians Hospitalizations, surgeries, and ER visits in previous 12 months Vitals Screenings to include cognitive, depression, and falls Referrals and appointments  In addition, I have reviewed and discussed with patient certain preventive protocols, quality metrics, and best practice recommendations. A written personalized care plan for preventive services as well as general preventive health recommendations were provided to patient.   Ellouise VEAR Haws, LPN   [id number]   After Visit Summary: (MyChart) Due to this being a telephonic visit, the after visit summary with patients personalized plan was offered to patient via MyChart   Notes: Nothing significant to report at this time.

## 2024-06-19 NOTE — Patient Instructions (Signed)
 Ms. Sieben , Thank you for taking time out of your busy schedule to complete your Annual Wellness Visit with me. I enjoyed our conversation and look forward to speaking with you again next year. I, as well as your care team,  appreciate your ongoing commitment to your health goals. Please review the following plan we discussed and let me know if I can assist you in the future. Your Game plan/ To Do List    Referrals: If you haven't heard from the office you've been referred to, please reach out to them at the phone provided.   Follow up Visits: Next Medicare AWV with our clinical staff: 06/24/25   Have you seen your provider in the last 6 months (3 months if uncontrolled diabetes)? No Next Office Visit with your provider: pt will schedule appt   Clinician Recommendations:  Aim for 30 minutes of exercise or brisk walking, 6-8 glasses of water, and 5 servings of fruits and vegetables each day.       This is a list of the screening recommended for you and due dates:  Health Maintenance  Topic Date Due   COVID-19 Vaccine (1) Never done   HIV Screening  Never done   Hepatitis C Screening  Never done   DTaP/Tdap/Td vaccine (1 - Tdap) Never done   Pneumococcal Vaccination (1 of 2 - PCV) Never done   Hepatitis B Vaccine (1 of 3 - 19+ 3-dose series) Never done   Zoster (Shingles) Vaccine (1 of 2) Never done   Medicare Annual Wellness Visit  06/19/2025   Mammogram  07/25/2025   Colon Cancer Screening  07/21/2032   HPV Vaccine  Aged Out   Meningitis B Vaccine  Aged Out   Flu Shot  Discontinued    Advanced directives: (Declined) Advance directive discussed with you today. Even though you declined this today, please call our office should you change your mind, and we can give you the proper paperwork for you to fill out. Advance Care Planning is important because it:  Vaccinations: declines all Influenza vaccine: recommend every Fall Pneumococcal vaccine: recommend once per lifetime  Prevnar-20 Tdap vaccine: recommend every 10 years Shingles vaccine: recommend Shingrix which is 2 doses 2-6 months apart and over 90% effective     Covid-19: recommend 2 doses one month apart with a booster 6 months later   [x]  Makes sure you receive the medical care that is consistent with your values, goals, and preferences  [x]  It provides guidance to your family and loved ones and reduces their decisional burden about whether or not they are making the right decisions based on your wishes.  Follow the link provided in your after visit summary or read over the paperwork we have mailed to you to help you started getting your Advance Directives in place. If you need assistance in completing these, please reach out to us  so that we can help you!  See attachments for Preventive Care and Fall Prevention Tips.

## 2024-06-27 DIAGNOSIS — N390 Urinary tract infection, site not specified: Secondary | ICD-10-CM | POA: Diagnosis not present

## 2024-06-28 ENCOUNTER — Other Ambulatory Visit: Payer: Self-pay | Admitting: Physician Assistant

## 2024-06-28 DIAGNOSIS — Z1231 Encounter for screening mammogram for malignant neoplasm of breast: Secondary | ICD-10-CM

## 2024-07-23 DIAGNOSIS — Z79899 Other long term (current) drug therapy: Secondary | ICD-10-CM | POA: Diagnosis not present

## 2024-07-23 DIAGNOSIS — M329 Systemic lupus erythematosus, unspecified: Secondary | ICD-10-CM | POA: Diagnosis not present

## 2024-07-23 DIAGNOSIS — M3501 Sicca syndrome with keratoconjunctivitis: Secondary | ICD-10-CM | POA: Diagnosis not present

## 2024-07-30 ENCOUNTER — Ambulatory Visit
Admission: RE | Admit: 2024-07-30 | Discharge: 2024-07-30 | Disposition: A | Discharge status: Home / Self Care | Source: Ambulatory Visit

## 2024-07-30 ENCOUNTER — Ambulatory Visit: Payer: Self-pay

## 2024-07-30 DIAGNOSIS — Z1231 Encounter for screening mammogram for malignant neoplasm of breast: Secondary | ICD-10-CM

## 2024-07-30 NOTE — Telephone Encounter (Signed)
 FYI Only or Action Required?: FYI only for provider.  Patient was last seen in primary care on 12/20/2023 by Job Lukes, PA.  Called Nurse Triage reporting Oral Swelling.  Symptoms began today.  Interventions attempted: OTC medications: zyrtec.  Symptoms are: unchanged.  Triage Disposition: Go to ED Now (or PCP Triage)  Patient/caregiver understands and will follow disposition?: UnsureCopied from CRM #[id number]. Topic: Clinical - Red Word Triage >> Jul 30, 2024 12:03 PM Mesmerise C wrote: [facility] that prompted transfer to Nurse Triage: Patient stated she was outside and her top lip is swollen and numb Reason for Disposition  [1] Severe swelling AND [2] cause unknown  Answer Assessment - Initial Assessment Questions Pt outside water plants/pulling weeds. Came in to see top lip is swollen. Didn't feel sting/bite.  Hasn't eaten today. Lip was completely numb. Top lip 3-4x size of bottom lip.Pt has taken 2 zyrtec. Due to severity of swelling and cause unknown, RN advised ED. Pt stated she's is going to wait another 30 mins and see if swelling goes down before making decision. Pt denies any SOB. CAL notified.      1. ONSET: When did the swelling start? (e.g., minutes, hours, days)     Just now 2. SEVERITY: How swollen is it? (e.g., part of an upper or lower lip, both lips)     3-4x as big 3. ITCHING: Is there any itching? If Yes, ask: How much?   (Scale 1-10; mild, moderate or severe)     Yes- mild 4. PAIN: Is the swelling painful to touch? If Yes, ask: How painful is it?   (Scale 1-10; mild, moderate or severe)     denies 5. CAUSE: What do you think is causing the lip swelling? (e.g., certain food or medicine, recent cosmetic treatment)     Not sure 6. RECURRENT SYMPTOM: Have you had lip swelling before? If Yes, ask: When was the last time? What happened that time?     denies 7. OTHER SYMPTOMS: Do you have any other symptoms? (e.g., fever, toothache)      denies  Protocols used: Lip Swelling-A-AH

## 2024-07-31 NOTE — Telephone Encounter (Signed)
 Left message on voicemail to call office. Calling to see how pt is feeling?

## 2024-09-04 DIAGNOSIS — M2550 Pain in unspecified joint: Secondary | ICD-10-CM | POA: Diagnosis not present

## 2024-09-04 DIAGNOSIS — M47816 Spondylosis without myelopathy or radiculopathy, lumbar region: Secondary | ICD-10-CM | POA: Diagnosis not present

## 2024-09-04 DIAGNOSIS — G894 Chronic pain syndrome: Secondary | ICD-10-CM | POA: Diagnosis not present

## 2024-09-04 DIAGNOSIS — M542 Cervicalgia: Secondary | ICD-10-CM | POA: Diagnosis not present

## 2024-09-04 DIAGNOSIS — Z79891 Long term (current) use of opiate analgesic: Secondary | ICD-10-CM | POA: Diagnosis not present

## 2024-09-10 ENCOUNTER — Encounter: Payer: Self-pay | Admitting: Physician Assistant

## 2024-09-10 DIAGNOSIS — R220 Localized swelling, mass and lump, head: Secondary | ICD-10-CM

## 2024-10-02 ENCOUNTER — Ambulatory Visit: Admitting: Internal Medicine

## 2024-10-02 ENCOUNTER — Encounter: Payer: Self-pay | Admitting: Internal Medicine

## 2024-10-02 VITALS — BP 130/78 | HR 69 | Temp 98.6°F | Resp 20 | Ht 69.0 in | Wt 232.0 lb

## 2024-10-02 DIAGNOSIS — L501 Idiopathic urticaria: Secondary | ICD-10-CM

## 2024-10-02 DIAGNOSIS — M3219 Other organ or system involvement in systemic lupus erythematosus: Secondary | ICD-10-CM | POA: Diagnosis not present

## 2024-10-02 DIAGNOSIS — R220 Localized swelling, mass and lump, head: Secondary | ICD-10-CM

## 2024-10-02 DIAGNOSIS — J3089 Other allergic rhinitis: Secondary | ICD-10-CM

## 2024-10-02 NOTE — Progress Notes (Signed)
 NEW PATIENT Date of Service/Encounter:  10/02/24 Referring provider: Job Lukes, PA Primary care provider: Job Lukes, PA  Subjective:  Kerri Young is a 53 y.o. female  presenting today for evaluation of angioedema, hives  History obtained from: chart review and patient.   Discussed the use of AI scribe software for clinical note transcription with the patient, who gave verbal consent to proceed.  History of Present Illness Kerri Young is a 53 year old female with lupus who presents with episodes of lip swelling and hives. She was referred by her rheumatologist for evaluation of lip swelling, suspected to be unrelated to lupus.  Angioedema and urticaria - Four to five episodes of lip swelling over the past couple of months - First episode occurred after being outside in the garden - During one episode, swelling resolved within three to four hours after taking two Zyrtec - Daily pruritus and urticaria if Zyrtec is not taken - Hives appear if Zyrtec is missed for more than one day - No family history of angioedema or random swelling - No prior allergy testing  Allergic rhinitis symptoms - Runny nose, itchy eyes, itchy roof of mouth, nasal congestion, sneezing, and occasional cough - Uses a nasal spray, possibly Flonase or fluticasone, which usually helps with cough  Antihistamine use and response - Longstanding use of Zyrtec for pruritus and urticaria prevention - Takes two Zyrtec in the morning - No sedation with Zyrtec - If Zyrtec is skipped for a couple of days, pruritus recurs, and hives develop if missed for another day  Medication allergies and triggers - Advised to avoid penicillin and sulfa drugs due to risk of lupus flare  Other swelling - History of synovitis with lupus   Dermatologic and family history - No eczema - Family history of eczema      Other allergy screening: Asthma: no Rhino conjunctivitis: yes Food allergy: no Medication  allergy: yes Hymenoptera allergy: no Urticaria: yes Eczema:no History of recurrent infections suggestive of immunodeficency: no Vaccinations are up to date.   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 30 tablet 1   azaTHIOprine (IMURAN) 50 MG tablet Take 150 mg by mouth daily.     baclofen (LIORESAL) 20 MG tablet Take 20 mg by mouth daily.      buPROPion  (WELLBUTRIN  XL) 300 MG 24 hr tablet Take 1 tablet (300 mg total) by mouth daily. 90 tablet 3   Calcium Carbonate-Vit D-Min (CALCIUM 1200 PO) Take by mouth.     citalopram  (CELEXA ) 40 MG tablet Take 1 tablet (40 mg total) by mouth daily. 90 tablet 3   Collagen-Vitamin C-Biotin (COLLAGEN 1500/C PO) Take by mouth.     estradiol  (ESTRACE ) 1 MG tablet Take 1 tablet (1 mg total) by mouth daily. Order # [id number] 90 tablet 3   gabapentin (NEURONTIN) 300 MG capsule Take 1,800 mg by mouth 3 (three) times daily.     hydrocortisone 1 % ointment Apply to lesion up to 2x/day  HYDROmorphone (DILAUDID) 2 MG tablet Take by mouth.     hydroxychloroquine (PLAQUENIL) 200 MG tablet Take 400 mg by mouth daily.     methadone (DOLOPHINE) 10 MG tablet Take 40 mg by mouth daily. Prescribed by  [facility]     nabumetone (RELAFEN) 500 MG tablet TAKE 1 TABLET BY MOUTH TWO  TIMES DAILY AS NEEDED     naloxone (NARCAN) nasal spray 4 mg/0.1 mL Place into the nose.     nitrofurantoin  (MACRODANTIN ) 50 MG capsule Take 50 mg by mouth daily. (Patient not taking: Reported on 10/02/2024)     ondansetron   (ZOFRAN ) 4 MG tablet Take 1 tablet (4 mg total) by mouth as directed for 2 doses. Take one Zofran  4 mg tablet 30-60 minutes before each prep dose 2 tablet 0   No current facility-administered medications for this visit.   Known Allergies:  Allergies  Allergen Reactions   Penicillins Other (See Comments)    Told not to take due to Lupus   Sulfa Antibiotics Other (See Comments)    Told not to take due to Lupus   Tramadol Other (See Comments)    Confusion and disorientation   Trazodone And Nefazodone Hives   Past Surgical History: Past Surgical History:  Procedure Laterality Date   CERVICAL BIOPSY  W/ LOOP ELECTRODE EXCISION     CESAREAN SECTION  1996, 1999   COLPOSCOPY     DILATION AND CURETTAGE OF UTERUS     LAPAROTOMY  2008   x 2    PELVIC LAPAROSCOPY     TOTAL ABDOMINAL HYSTERECTOMY  2008   TUBAL LIGATION     Family History: Family History  Problem Relation Age of Onset   Heart disease Mother    Hypertension Mother    Dementia Mother    Diabetes Brother    Heart disease Maternal Grandfather    Heart attack Maternal Grandfather    COPD Maternal Aunt    Colon cancer Neg Hx    Colon polyps Neg Hx    Esophageal cancer Neg Hx    Rectal cancer Neg Hx    Stomach cancer Neg Hx    Lupus Neg Hx    Chronic bronchitis Neg Hx    Angioedema Neg Hx    Emphysema Neg Hx    Social History: Carron lives [facility] that is 51 years old, no mildew, carpet throughout,  gas heating, central cooling, dogs with access to bedroom, no roaches and bed is 2 feet off the floor, dust mite precautions on bed and pillows, no tobacco exposure   ROS:  All other systems negative except as noted per HPI.  Objective:  Blood pressure 130/78, pulse 69, temperature 98.6 F (37 C), temperature source Oral, resp. rate 20, height 5' 9 (1.753 m), weight 232 lb (105.2 kg), SpO2 98%. Body mass index is 34.26 kg/m. Physical Exam:  General Appearance:  Alert, cooperative, no distress, appears stated age   Head:  Normocephalic, without obvious abnormality, atraumatic  Eyes:  Conjunctiva clear, EOM's intact  Ears EACs normal bilaterally and normal TMs bilaterally  Nose: Nares normal, normal mucosa, no visible anterior polyps, and septum midline  Throat: Lips, tongue normal; teeth and gums normal, normal posterior oropharynx  Neck: Supple, symmetrical  Lungs:   clear to auscultation bilaterally, Respirations unlabored, no coughing  Heart:  regular rate and rhythm and no murmur, Appears well perfused  Extremities: No edema  Skin: Skin color, texture, turgor normal and no rashes or lesions on visualized portions of  skin  Neurologic: No gross deficits   Diagnostics: None done    Labs:  Lab Orders         Tryptase         Complement component c1q         IgE         Chronic Urticaria (CU) Eval       Assessment and Plan  Assessment and Plan Assessment & Plan 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 Symptoms managed with Zyrtec. Hereditary angioedema unlikely. Condition affects quality of life but is not life-threatening. Potential triggers include morphine derivatives. Will need to rule out allergic causes as well  - Ordered C1q lab test to rule out Acquired angioedema. - Will get hive labs: IgE, tryptase, chronic urticaria panel  - Increase Zyrtec to up to four tablets per day if needed, monitor for drowsiness. - Consider adding Pepcid 20mg  daily  if higher doses of Zyrtec are insufficient. - Scheduled allergy testing (1-68)  - hold all anithistamines for 3 days prior  - Discuss potential use of Xolair if antihistamines fail.  Would avoid cyclosporine and rhapsido given lupus and other immunosuppresants      Follow up: Wednesday 11/19 at 8:30 for allergy testing (1-68)  Hold all antihistamines for 3 days prior    This note in its entirety was forwarded to the Provider who requested  this consultation.  Other:    Thank you for your kind referral. I appreciate the opportunity to take part in Kerri Young's care. Please do not hesitate to contact me with questions.  Sincerely,  Thank you so much for letting me partake in your care today.  Don't hesitate to reach out if you have any additional concerns!  Hargis Springer, MD  [facility] [place] [place]

## 2024-10-02 NOTE — Patient Instructions (Signed)
 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 Symptoms managed with Zyrtec. Hereditary angioedema unlikely. Condition affects quality of life but is not life-threatening. Potential triggers include morphine derivatives. Will need to rule out allergic causes as well  - Ordered C1q lab test to rule out Acquired angioedema. - Will get hive labs: IgE, tryptase, chronic urticaria panel  - Increase Zyrtec to up to four tablets per day if needed, monitor for drowsiness. - Consider adding Pepcid 20mg  daily  if higher doses of Zyrtec are insufficient. - Scheduled allergy testing (1-68)  - hold all anithistamines for 3 days prior  - Discuss potential use of Xolair if antihistamines fail.  Would avoid cyclosporine and rhapsido given lupus and other immunosuppresants      Follow up: Wednesday 11/19 at 8:30 for allergy testing (1-68)  Hold all antihistamines for 3 days prior

## 2024-10-09 ENCOUNTER — Ambulatory Visit: Admitting: Internal Medicine

## 2024-10-09 DIAGNOSIS — J302 Other seasonal allergic rhinitis: Secondary | ICD-10-CM

## 2024-10-09 DIAGNOSIS — J3089 Other allergic rhinitis: Secondary | ICD-10-CM

## 2024-10-09 LAB — CHRONIC URTICARIA (CU) EVAL
ALT: 13 IU/L (ref 0–32)
Basophils Absolute: 0 x10E3/uL (ref 0.0–0.2)
Basos: 0 %
CRP: 3 mg/L (ref 0–10)
EOS (ABSOLUTE): 0.1 x10E3/uL (ref 0.0–0.4)
Eos: 2 %
Hematocrit: 39.4 % (ref 34.0–46.6)
Hemoglobin: 12.7 g/dL (ref 11.1–15.9)
Immature Grans (Abs): 0 x10E3/uL (ref 0.0–0.1)
Immature Granulocytes: 0 %
Lymphocytes Absolute: 1.2 x10E3/uL (ref 0.7–3.1)
Lymphs: 34 %
MCH: 30.2 pg (ref 26.6–33.0)
MCHC: 32.2 g/dL (ref 31.5–35.7)
MCV: 94 fL (ref 79–97)
Monocytes Absolute: 0.4 x10E3/uL (ref 0.1–0.9)
Monocytes: 12 %
Neutrophils Absolute: 1.7 x10E3/uL (ref 1.4–7.0)
Neutrophils: 52 %
Platelets: 208 x10E3/uL (ref 150–450)
Pooled Donor- BAT CU: 7.8 % (ref 0.00–10.60)
RBC: 4.2 x10E6/uL (ref 3.77–5.28)
RDW: 14.1 % (ref 11.7–15.4)
Sed Rate: 31 mm/h (ref 0–40)
TSH: 3.01 u[IU]/mL (ref 0.450–4.500)
Thyroperoxidase Ab SerPl-aCnc: <9 [IU]/mL (ref 0–34)
WBC: 3.4 x10E3/uL (ref 3.4–10.8)

## 2024-10-09 LAB — TRYPTASE: Tryptase: 4.6 ug/L (ref 2.2–13.2)

## 2024-10-09 LAB — IGE: IgE (Immunoglobulin E), Serum: 73 [IU]/mL (ref 6–495)

## 2024-10-09 LAB — COMPLEMENT COMPONENT C1Q: Complement C1Q: 15.5 mg/dL (ref 10.3–20.5)

## 2024-10-09 NOTE — Patient Instructions (Addendum)
 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 Symptoms managed with Zyrtec. Hereditary angioedema unlikely. Condition affects quality of life but is not life-threatening. Potential triggers include morphine derivatives. Will need to rule out allergic causes as well  - Labs: normal/negative  - Allergy test (10/09/24): grass, weed, tree, mold, cat, dust mite  - Start avoidance measures  - Continue  Zyrtec 10mg  to up to four tablets per day if needed, monitor for drowsiness. - Consider adding Pepcid 20mg  daily  if higher doses of Zyrtec are insufficient.   Follow up: 3 months   Reducing Pollen Exposure  The American Academy of Allergy, Asthma and Immunology suggests the following steps to reduce your exposure to pollen during allergy seasons.    Do not hang sheets or clothing out to dry; pollen may collect on these items. Do not mow lawns or spend time around freshly cut grass; mowing stirs up pollen. Keep windows closed at night.  Keep car windows closed while driving. Minimize morning activities outdoors, a time when pollen counts are usually at their highest. Stay indoors as much as possible when pollen counts or humidity is high and on windy days when pollen tends to remain in the air longer. Use air conditioning when possible.  Many air conditioners have filters that trap the pollen spores. Use a HEPA room air filter to remove pollen form the indoor air you breathe.  Control of Mold Allergen   Mold and fungi can grow on a variety of surfaces provided certain temperature and moisture conditions exist.  Outdoor molds grow on plants, decaying vegetation and soil.  The major outdoor mold, Alternaria and Cladosporium, are found in very high numbers during hot and dry conditions.  Generally, a late Summer - Fall peak is seen for common outdoor fungal spores.  Rain will temporarily lower outdoor mold spore  count, but counts rise rapidly when the rainy period ends.  The most important indoor molds are Aspergillus and Penicillium.  Dark, humid and poorly ventilated basements are ideal sites for mold growth.  The next most common sites of mold growth are the bathroom and the kitchen.  Outdoor (Seasonal) Mold Control  Use air conditioning and keep windows closed Avoid exposure to decaying vegetation. Avoid leaf raking. Avoid grain handling. Consider wearing a face mask if working in moldy areas.    Indoor (Perennial) Mold Control   Maintain humidity below 50%. Clean washable surfaces with 5% bleach solution. Remove sources e.g. contaminated carpets.    DUST MITE AVOIDANCE MEASURES:  There are three main measures that need and can be taken to avoid house dust mites:  Reduce accumulation of dust in general -reduce furniture, clothing, carpeting, books, stuffed animals, especially in bedroom  Separate yourself from the dust -use pillow and mattress encasements (can be found at stores such as [employer] or online) -avoid direct exposure to air condition flow -use a HEPA filter device, especially in the bedroom; you can also use a HEPA filter vacuum cleaner -wipe dust with a moist towel instead of a dry towel or broom when cleaning  Decrease mites and/or their secretions -wash clothing and linen and stuffed animals at highest temperature possible, at least every 2 weeks -stuffed animals can also be placed in a bag and put in a freezer overnight  Despite the above measures, it is impossible to eliminate dust mites or their allergen completely from your home.  With the above measures the burden of mites  in your home can be diminished, with the goal of minimizing your allergic symptoms.  Success will be reached only when implementing and using all means together.  Control of Mold Allergen   Mold and fungi can grow on a variety of surfaces provided certain temperature and moisture  conditions exist.  Outdoor molds grow on plants, decaying vegetation and soil.  The major outdoor mold, Alternaria and Cladosporium, are found in very high numbers during hot and dry conditions.  Generally, a late Summer - Fall peak is seen for common outdoor fungal spores.  Rain will temporarily lower outdoor mold spore count, but counts rise rapidly when the rainy period ends.  The most important indoor molds are Aspergillus and Penicillium.  Dark, humid and poorly ventilated basements are ideal sites for mold growth.  The next most common sites of mold growth are the bathroom and the kitchen.  Outdoor (Seasonal) Mold Control  Use air conditioning and keep windows closed Avoid exposure to decaying vegetation. Avoid leaf raking. Avoid grain handling. Consider wearing a face mask if working in moldy areas.    Indoor (Perennial) Mold Control   Maintain humidity below 50%. Clean washable surfaces with 5% bleach solution. Remove sources e.g. contaminated carpets.

## 2024-10-09 NOTE — Progress Notes (Signed)
 Date of Service/Encounter:  10/09/24  Allergy  testing appointment   Initial visit on 10/02/24, seen for lip swelling .  Please see that note for additional details.  Today reports for allergy  diagnostic testing:    DIAGNOSTICS:  Skin Testing: Environmental allergy  panel and select foods. Adequate positive and negative controls Results discussed with patient/family.  Airborne Adult Perc - 10/09/24 0848     Time Antigen Placed 0848    Allergen Manufacturer Jestine    Location Back    Number of Test 55    1. Control-Buffer 50% Glycerol Negative    2. Control-Histamine 4+    3. Bahia 2+    4. Bermuda 3+    5. Johnson 3+    6. [place]  Blue 4+    7. Meadow Fescue 3+    8. Perennial Rye 3+    9. Timothy 3+    10. Ragweed Mix Negative    11. Cocklebur Negative    12. Plantain,  English 2+    13. Baccharis Negative    14. Dog Fennel 2+    15. Russian Thistle 2+    16. Lamb's Quarters 2+    17. Sheep Sorrell Negative    18. Rough Pigweed Negative    19. Marsh Elder, Rough Negative    20. Mugwort, Common Negative    21. Box, Elder Negative    22. Cedar, red 2+    23. Sweet Gum 2+    24. Pecan Pollen Negative    25. Pine Mix Negative    26. Walnut, Black Pollen Negative    27. Red Mulberry Negative    28. Ash Mix 2+    29. Birch Mix 2+    30. Beech American 2+    31. Cottonwood, Eastern 2+    32. Hickory, White 2+    33. Maple Mix Negative    34. Oak, Eastern Mix 2+    35. Sycamore Eastern Negative    36. Alternaria Alternata Negative    37. Cladosporium Herbarum Negative    39. Penicillium Mix Negative    40. Bipolaris Sorokiniana (Helminthosporium) Negative    41. Drechslera Spicifera (Curvularia) Negative    42. Mucor Plumbeus Negative    43. Fusarium Moniliforme Negative    44. Aureobasidium Pullulans (pullulara) Negative    45. Rhizopus Oryzae Negative    46. Botrytis Cinera Negative    47. Epicoccum Nigrum Negative    48. Phoma Betae Negative    49. Dust  Mite Mix Negative    50. Cat Hair 10,000 BAU/ml Negative    51.  Dog Epithelia Negative    52. Mixed Feathers Negative    53. Horse Epithelia Negative    54. Cockroach, German Negative    55. Tobacco Leaf Negative          13 Food Perc - 10/09/24 0850       Test Information   Time Antigen Placed 9149    Allergen Manufacturer Jestine    Location Back    Number of allergen test 13      Food   1. Peanut Negative    2. Soybean Negative    3. Wheat Negative    4. Sesame Negative    5. Milk, Cow Negative    6. Casein Negative    7. Egg White, Chicken Negative    8. Shellfish Mix Negative    9. Fish Mix Negative    10. Cashew Negative    11. [facility] Food Negative  12. Almond Negative    13. Hazelnut Negative          Intradermal - 10/09/24 0928     Time Antigen Placed 0930    Location Arm    Number of Test 10    Control Negative    Ragweed Mix Negative    Mold 1 2+    Mold 2 Negative    Mold 3 Negative    Mold 4 Negative    Mite Mix 2+    Cat 2+    Dog Negative    Cockroach Negative          Allergy testing results were read and interpreted by myself, documented by clinical staff.  Patient provided with copy of allergy testing along with avoidance measures when indicated.   Hargis Springer, MD  [facility] [place] 

## 2024-10-16 ENCOUNTER — Other Ambulatory Visit: Payer: Self-pay | Admitting: Nurse Practitioner

## 2024-10-16 DIAGNOSIS — Z79890 Hormone replacement therapy (postmenopausal): Secondary | ICD-10-CM

## 2024-10-21 ENCOUNTER — Encounter: Payer: Self-pay | Admitting: Nurse Practitioner

## 2024-10-21 NOTE — Telephone Encounter (Signed)
 Med refill request: estrace   Last AEX: 12/19/23  Next AEX: not scheduled message sent to fd  Last MMG (if hormonal med) 07/30/24 birads cat 1 neg  Refill authorized: last rx 12/19/23 #90 with 3 refills.

## 2024-10-21 NOTE — Telephone Encounter (Signed)
 Refill provided to get her to annual exam. Thanks.

## 2024-10-21 NOTE — Telephone Encounter (Signed)
 See refill encounter dated 10/16/24.   Call placed to patient, left detailed message, ok per dpr. Advised refill request was received and per our office refill protocol, were reaching out to schedule next AEX. Advised refill request has been sent to Tiffany to review. Our office will return call if any additional recommendations. Return call to office at [phone], opt 1 for appts.   Tiffany -see refill request dated 10/16/24.   Encounter closed.

## 2024-11-24 ENCOUNTER — Other Ambulatory Visit: Payer: Self-pay | Admitting: Physician Assistant

## 2024-12-03 ENCOUNTER — Encounter: Payer: Self-pay | Admitting: Nurse Practitioner

## 2024-12-03 DIAGNOSIS — Z79890 Hormone replacement therapy (postmenopausal): Secondary | ICD-10-CM

## 2024-12-04 ENCOUNTER — Encounter: Payer: Self-pay | Admitting: Physician Assistant

## 2024-12-04 DIAGNOSIS — G8929 Other chronic pain: Secondary | ICD-10-CM

## 2024-12-04 DIAGNOSIS — M3500 Sicca syndrome, unspecified: Secondary | ICD-10-CM

## 2024-12-04 DIAGNOSIS — G894 Chronic pain syndrome: Secondary | ICD-10-CM

## 2024-12-04 DIAGNOSIS — M328 Other forms of systemic lupus erythematosus: Secondary | ICD-10-CM

## 2024-12-04 MED ORDER — ESTRADIOL 1 MG PO TABS: 1.0000 mg | ORAL_TABLET | Freq: Every day | ORAL | 0 refills | Status: DC

## 2024-12-04 NOTE — Telephone Encounter (Signed)
 GCG r/s AEX  Med refill request:estradiol  1mg  tab PO daily Last AEX: 12/19/23 -TW Next AEX: 12/26/24- TW Last MMG (if hormonal med)07/30/24 -BiRads 1 neg  Rx pended for 90 days supply, patient uses mail order. 0RF  Refill authorized: Please Advise?

## 2024-12-25 NOTE — Progress Notes (Unsigned)
 "  Kerri Young 1971/10/31 [id number]   History:  54 y.o. [id number] presents for breast and pelvic exam. S/P 2008 TAH BSO for benign reasons. PO estradiol , did not tolerate patch. 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 H/O Lupus, Sjogren's, gestational diabetes, anxiety.   Gynecologic History No LMP recorded. Patient has had a hysterectomy.   Contraception/Family planning: status post hysterectomy Sexually active: Yes  Health Maintenance Last Pap (vaginal): 12/19/2023. Results were: normal neg HR HPV Last mammogram: 07/30/2024. Results were: Normal Last colonoscopy: 07/21/2022. Results were: Normal, 10-year recall Last Dexa: Not indicated Exercising: Yes. walking  Smoker: no      06/19/2024    9:06 AM  Depression screen PHQ 2/9  Decreased Interest 0  Down, Depressed, Hopeless 1  PHQ - 2 Score 1     Past medical history, past surgical history, family history and social history were all reviewed and documented in the EPIC chart. On disability for Lupus. 98 yo daughter, lives with her. 18 yo daughter, in [place].   ROS:  A ROS was performed and pertinent positives and negatives are included.  Exam:  There were no vitals filed for this visit.  There is no height or weight on file to calculate BMI.   General appearance:  Normal Thyroid:  Symmetrical, normal in size, without palpable masses or nodularity. Respiratory  Auscultation:  Clear without wheezing or rhonchi Cardiovascular  Auscultation:  Regular rate, without rubs, murmurs or gallops  Edema/varicosities:  Not grossly evident Abdominal  Soft,nontender, without masses, guarding or rebound.  Liver/spleen:  No organomegaly noted  Hernia:  None appreciated  Skin  Inspection:  Grossly normal Breasts: Examined lying and sitting.   Right: Without masses, retractions, nipple discharge or axillary adenopathy.   Left: Without masses, retractions, nipple discharge or axillary adenopathy. Pelvic: External genitalia:  no lesions              Urethra:  normal  appearing urethra with no masses, tenderness or lesions              Bartholins and Skenes: normal                 Vagina: normal appearing vagina with normal color and discharge, no lesions              Cervix: absent Bimanual Exam:  Uterus: absent              Adnexa: no mass, fullness, tenderness              Rectovaginal: Deferred              Anus:  normal, no lesions  Patient informed chaperone available to be present for breast and pelvic exam. Patient has requested no chaperone to be present. Patient has been advised what will be completed during breast and pelvic exam.   Assessment/Plan:  54 y.o. [id number] for breast and pelvic exam.    Encounter for breast and pelvic examination - Education provided on SBEs, importance of preventative screenings, current guidelines, high calcium diet, regular exercise, and multivitamin daily. Labs with PCP.   VAIN I (vaginal intraepithelial neoplasia grade I) - Plan: Cytology - PAP( [place]). 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 Vaginal pap today per guidelines.   Postmenopausal hormone therapy - Plan: estradiol  (ESTRACE ) 1 MG tablet daily. Did not tolerate patch. Good management.   Screening for breast cancer - Normal mammogram history.  Continue annual screenings.  Normal breast exam today.  Screening for colon cancer - 06/2022 colonoscopy. Will repeat at  10-year interval per GI's recommendation.   Screening for osteoporosis - Average risk. Will plan DXA at age 64.   No follow-ups on file.   Annabella DELENA Shutter DNP, 3:03 PM 12/25/2024 "

## 2024-12-26 ENCOUNTER — Ambulatory Visit: Admitting: Nurse Practitioner

## 2024-12-26 ENCOUNTER — Encounter: Payer: Self-pay | Admitting: Nurse Practitioner

## 2024-12-26 ENCOUNTER — Telehealth: Payer: Self-pay

## 2024-12-26 VITALS — BP 120/78 | HR 85 | Ht 68.5 in | Wt 230.0 lb

## 2024-12-26 DIAGNOSIS — Z79890 Hormone replacement therapy (postmenopausal): Secondary | ICD-10-CM | POA: Diagnosis not present

## 2024-12-26 DIAGNOSIS — Z9289 Personal history of other medical treatment: Secondary | ICD-10-CM

## 2024-12-26 DIAGNOSIS — Z01419 Encounter for gynecological examination (general) (routine) without abnormal findings: Secondary | ICD-10-CM

## 2024-12-26 DIAGNOSIS — Z87411 Personal history of vaginal dysplasia: Secondary | ICD-10-CM | POA: Diagnosis not present

## 2024-12-26 DIAGNOSIS — Z9189 Other specified personal risk factors, not elsewhere classified: Secondary | ICD-10-CM | POA: Diagnosis not present

## 2024-12-26 DIAGNOSIS — Z124 Encounter for screening for malignant neoplasm of cervix: Secondary | ICD-10-CM

## 2024-12-26 DIAGNOSIS — Z1331 Encounter for screening for depression: Secondary | ICD-10-CM | POA: Diagnosis not present

## 2024-12-26 DIAGNOSIS — R87622 Low grade squamous intraepithelial lesion on cytologic smear of vagina (LGSIL): Secondary | ICD-10-CM

## 2024-12-26 MED ORDER — ESTRADIOL 1 MG PO TABS: 1.0000 mg | ORAL_TABLET | Freq: Every day | ORAL | 3 refills | Status: AC

## 2024-12-26 NOTE — Telephone Encounter (Signed)
 Received [facility] communication on a patient of Dr Lorin . Verifying dx for date of service 10/02/2024. Provided R22.0 AND L50.1 and added L50.3. faxed back to [phone] and sent original to scan center

## 2025-01-08 ENCOUNTER — Ambulatory Visit: Admitting: Internal Medicine

## 2025-06-24 ENCOUNTER — Ambulatory Visit

## 2025-12-31 ENCOUNTER — Encounter: Admitting: Nurse Practitioner
